# Patient Record
Sex: Female | Born: 1955 | Race: White | Hispanic: No | Marital: Single | Smoking: Never smoker
Health system: Southern US, Community
[De-identification: ages and names within clinical notes are randomized; demographics above are authoritative.]

## PROBLEM LIST (undated history)

## (undated) DIAGNOSIS — E559 Vitamin D deficiency, unspecified: Secondary | ICD-10-CM

## (undated) DIAGNOSIS — Z973 Presence of spectacles and contact lenses: Secondary | ICD-10-CM

## (undated) DIAGNOSIS — E209 Hypoparathyroidism, unspecified: Secondary | ICD-10-CM

## (undated) DIAGNOSIS — I499 Cardiac arrhythmia, unspecified: Secondary | ICD-10-CM

## (undated) DIAGNOSIS — E039 Hypothyroidism, unspecified: Secondary | ICD-10-CM

## (undated) DIAGNOSIS — E669 Obesity, unspecified: Secondary | ICD-10-CM

## (undated) DIAGNOSIS — M25569 Pain in unspecified knee: Secondary | ICD-10-CM

## (undated) DIAGNOSIS — K08109 Complete loss of teeth, unspecified cause, unspecified class: Secondary | ICD-10-CM

## (undated) DIAGNOSIS — N189 Chronic kidney disease, unspecified: Secondary | ICD-10-CM

## (undated) DIAGNOSIS — M7989 Other specified soft tissue disorders: Secondary | ICD-10-CM

## (undated) DIAGNOSIS — E785 Hyperlipidemia, unspecified: Secondary | ICD-10-CM

## (undated) DIAGNOSIS — K000 Anodontia: Secondary | ICD-10-CM

## (undated) DIAGNOSIS — N649 Disorder of breast, unspecified: Secondary | ICD-10-CM

## (undated) DIAGNOSIS — Z789 Other specified health status: Secondary | ICD-10-CM

## (undated) DIAGNOSIS — F79 Unspecified intellectual disabilities: Secondary | ICD-10-CM

## (undated) HISTORY — DX: Hypoparathyroidism, unspecified: E20.9

## (undated) HISTORY — DX: Disorder of breast, unspecified: N64.9

## (undated) HISTORY — DX: Obesity, unspecified: E66.9

## (undated) HISTORY — DX: Pain in unspecified knee: M25.569

## (undated) HISTORY — DX: Other specified soft tissue disorders: M79.89

## (undated) HISTORY — PX: BREAST SURGERY: SHX581

## (undated) HISTORY — DX: Hyperlipidemia, unspecified: E78.5

## (undated) HISTORY — DX: Chronic kidney disease, unspecified: N18.9

## (undated) HISTORY — PX: MULTIPLE TOOTH EXTRACTIONS: SHX2053

## (undated) HISTORY — DX: Cardiac arrhythmia, unspecified: I49.9

## (undated) HISTORY — DX: Vitamin D deficiency, unspecified: E55.9

## (undated) HISTORY — DX: Hypothyroidism, unspecified: E03.9

## (undated) HISTORY — PX: TONSILLECTOMY: SUR1361

---

## 2011-09-25 ENCOUNTER — Other Ambulatory Visit (HOSPITAL_COMMUNITY): Payer: Self-pay | Admitting: Family Medicine

## 2011-09-25 DIAGNOSIS — Z1231 Encounter for screening mammogram for malignant neoplasm of breast: Secondary | ICD-10-CM

## 2011-10-17 ENCOUNTER — Ambulatory Visit (HOSPITAL_COMMUNITY): Payer: Self-pay

## 2011-11-01 ENCOUNTER — Ambulatory Visit (HOSPITAL_COMMUNITY)
Admission: RE | Admit: 2011-11-01 | Discharge: 2011-11-01 | Disposition: A | Discharge status: Home / Self Care | Payer: Medicaid Other | Source: Ambulatory Visit | Attending: Family Medicine | Admitting: Family Medicine

## 2011-11-01 DIAGNOSIS — Z1231 Encounter for screening mammogram for malignant neoplasm of breast: Secondary | ICD-10-CM | POA: Insufficient documentation

## 2011-11-03 ENCOUNTER — Other Ambulatory Visit: Payer: Self-pay | Admitting: Family Medicine

## 2011-11-03 DIAGNOSIS — R928 Other abnormal and inconclusive findings on diagnostic imaging of breast: Secondary | ICD-10-CM

## 2011-11-10 ENCOUNTER — Other Ambulatory Visit: Payer: Self-pay | Admitting: Family Medicine

## 2011-11-10 ENCOUNTER — Ambulatory Visit
Admission: RE | Admit: 2011-11-10 | Discharge: 2011-11-10 | Disposition: A | Discharge status: Home / Self Care | Payer: Medicaid Other | Source: Ambulatory Visit | Attending: Family Medicine | Admitting: Family Medicine

## 2011-11-10 DIAGNOSIS — R928 Other abnormal and inconclusive findings on diagnostic imaging of breast: Secondary | ICD-10-CM

## 2011-11-16 ENCOUNTER — Ambulatory Visit
Admission: RE | Admit: 2011-11-16 | Discharge: 2011-11-16 | Disposition: A | Discharge status: Home / Self Care | Payer: Medicaid Other | Source: Ambulatory Visit | Attending: Family Medicine | Admitting: Family Medicine

## 2011-11-16 ENCOUNTER — Other Ambulatory Visit: Payer: Self-pay | Admitting: Family Medicine

## 2011-11-16 DIAGNOSIS — R928 Other abnormal and inconclusive findings on diagnostic imaging of breast: Secondary | ICD-10-CM

## 2011-11-17 ENCOUNTER — Inpatient Hospital Stay: Admission: RE | Admit: 2011-11-17 | Payer: Medicaid Other | Source: Ambulatory Visit

## 2011-12-01 ENCOUNTER — Encounter (INDEPENDENT_AMBULATORY_CARE_PROVIDER_SITE_OTHER): Payer: Self-pay | Admitting: Surgery

## 2011-12-01 ENCOUNTER — Ambulatory Visit (INDEPENDENT_AMBULATORY_CARE_PROVIDER_SITE_OTHER): Payer: Medicaid Other | Admitting: Surgery

## 2011-12-01 ENCOUNTER — Other Ambulatory Visit (INDEPENDENT_AMBULATORY_CARE_PROVIDER_SITE_OTHER): Payer: Self-pay | Admitting: Surgery

## 2011-12-01 VITALS — BP 142/88 | HR 80 | Temp 97.8°F | Resp 18 | Ht 62.0 in | Wt 172.4 lb

## 2011-12-01 DIAGNOSIS — L905 Scar conditions and fibrosis of skin: Secondary | ICD-10-CM

## 2011-12-01 DIAGNOSIS — N6489 Other specified disorders of breast: Secondary | ICD-10-CM

## 2011-12-01 NOTE — Patient Instructions (Signed)
Lumpectomy, Breast Conserving Surgery A lumpectomy is breast surgery that removes only part of the breast. Another name used may be partial mastectomy. The amount removed varies. Make sure you understand how much of your breast will be removed. Reasons for a lumpectomy:  Any solid breast mass.   Grouped significant nodularity that may be confused with a solitary breast mass.  Lumpectomy is the most common form of breast cancer surgery today. The surgeon removes the portion of your breast which contains the tumor (cancer). This is the lump. Some normal tissue around the lump is also removed to be sure that all the tumor has been removed.  If cancer cells are found in the margins where the breast tissue was removed, your surgeon will do more surgery to remove the remaining cancer tissue. This is called re-excision surgery. Radiation and/or chemotherapy treatments are often given following a lumpectomy to kill any cancer cells that could possibly remain.  REASONS YOU MAY NOT BE ABLE TO HAVE BREAST CONSERVING SURGERY:  The tumor is located in more than one place.   Your breast is small and the tumor is large so the breast would be disfigured.   The entire tumor removal is not successful with a lumpectomy.   You cannot commit to a full course of chemotherapy, radiation therapy or are pregnant and cannot have radiation.   You have previously had radiation to the breast to treat cancer.  HOW A LUMPECTOMY IS PERFORMED If overnight nursing is not required following a biopsy, a lumpectomy can be performed as a same-day surgery. This can be done in a hospital, clinic, or surgical center. The anesthesia used will depend on your surgeon. They will discuss this with you. A general anesthetic keeps you sleeping through the procedure. LET YOUR CAREGIVERS KNOW ABOUT THE FOLLOWING:  Allergies   Medications taken including herbs, eye drops, over the counter medications, and creams.   Use of steroids (by  mouth or creams)   Previous problems with anesthetics or Novocaine.   Possibility of pregnancy, if this applies   History of blood clots (thrombophlebitis)   History of bleeding or blood problems.   Previous surgery   Other health problems  BEFORE THE PROCEDURE You should be present one hour prior to your procedure unless directed otherwise.  AFTER THE PROCEDURE  After surgery, you will be taken to the recovery area where a nurse will watch and check your progress. Once you're awake, stable, and taking fluids well, barring other problems you will be allowed to go home.   Ice packs applied to your operative site may help with discomfort and keep the swelling down.   A small rubber drain may be placed in the breast for a couple of days to prevent a hematoma from developing in the breast.   A pressure dressing may be applied for 24 to 48 hours to prevent bleeding.   Keep the wound dry.   You may resume a normal diet and activities as directed. Avoid strenuous activities affecting the arm on the side of the biopsy site such as tennis, swimming, heavy lifting (more than 10 pounds) or pulling.   Bruising in the breast is normal following this procedure.   Wearing a bra - even to bed - may be more comfortable and also help keep the dressing on.   Change dressings as directed.   Only take over-the-counter or prescription medicines for pain, discomfort, or fever as directed by your caregiver.  Call for your results as   instructed by your surgeon. Remember it is your responsibility to get the results of your lumpectomy if your surgeon asked you to follow-up. Do not assume everything is fine if you have not heard from your caregiver. SEEK MEDICAL CARE IF:   There is increased bleeding (more than a small spot) from the wound.   You notice redness, swelling, or increasing pain in the wound.   Pus is coming from wound.   An unexplained oral temperature above 102 F (38.9 C) develops.     You notice a foul smell coming from the wound or dressing.  SEEK IMMEDIATE MEDICAL CARE IF:   You develop a rash.   You have difficulty breathing.   You have any allergic problems.  Document Released: 04/17/2006 Document Revised: 02/23/2011 Document Reviewed: 07/19/2006 ExitCare Patient Information 2012 [employer]. Lumpectomy, Breast Conserving Surgery Care After Please read the instructions outlined below and refer to this sheet in the next few weeks. These discharge instructions provide you with general information on caring for yourself after you leave the hospital. Your surgeon may also give you specific instructions. While your treatment has been planned according to the most current medical practices available, unavoidable complications occasionally occur. If you have any problems or questions after discharge, please call your surgeon. Reasons for a lumpectomy:  Any solid breast mass.   Grouped significant nodularity that may be confused with a solitary breast mass.  AFTER THE PROCEDURE  After surgery, you will be taken to the recovery area where a nurse will watch and check your progress. Once you're awake, stable, and taking fluids well, barring other problems you will be allowed to go home.   Ice packs applied to your operative site may help with discomfort and keep the swelling down.   A small rubber drain may be placed in the incision for a couple of days to prevent a hematoma in the breast.   A pressure dressing may be applied for 24 to 48 hours to prevent bleeding.   Keep the wound dry.   You may resume a normal diet and activities as directed. Avoid strenuous activities affecting the arm on the side of the biopsy site such as tennis, swimming, heavy lifting (more than 10 pounds) or pulling.   Bruising in the breast is normal following this procedure.   Wearing a bra - even to bed - may be more comfortable and also help keep the dressing on.   Change  dressings as directed.   Only take over-the-counter or prescription medicines for pain, discomfort, or fever as directed by your caregiver.  Call for your results as instructed by your surgeon. Remember it isyour responsibility to get the results of your lumpectomy if your surgeon asked you to follow-up. Do not assume everything is fine if you have not heard from your caregiver. SEEK MEDICAL CARE IF:   There is increased bleeding (more than a small spot) from the wound.   You notice redness, swelling, or increasing pain in the wound.   Pus is coming from wound.   An unexplained oral temperature above 102 F (38.9 C) develops.   You notice a foul smell coming from the wound or dressing.  SEEK IMMEDIATE MEDICAL CARE IF:   You develop a rash.   You have difficulty breathing.   You have any allergic problems.  Document Released: 03/22/2006 Document Revised: 02/23/2011 Document Reviewed: 02/22/2007 ExitCare Patient Information 2012 [employer]. 

## 2011-12-01 NOTE — Progress Notes (Signed)
Patient ID: Martha Bowers, female   DOB: Jun 20, 1955, 56 y.o.   MRN: [id number]  Chief Complaint  Patient presents with  . Breast Problem    new pt- eval breast lesion    HPI Martha Bowers is a 56 y.o. female.  Patient sent at the request of Dr. Nedra Hai for abnormal mammogram. Patient had abnormal cluster of calcifications in the right breast. Core biopsy was done which showed complex sclerosing lesion worrisome for radial scar. Patient denies breast mass, breast pain or nipple discharge bilaterally. HPI  Past Medical History  Diagnosis Date  . Breast lesion     right breast    History reviewed. No pertinent past surgical history.  Family History  Problem Relation Age of Onset  . Heart disease Mother   . Cancer Father     lung    Social History History  Substance Use Topics  . Smoking status: Never Smoker   . Smokeless tobacco: Not on file  . Alcohol Use: No    No Known Allergies  Current Outpatient Prescriptions  Medication Sig Dispense Refill  . LEVOTHYROXINE SODIUM PO Take by mouth daily.        Review of Systems Review of Systems  Constitutional: Negative for fever, chills and unexpected weight change.  HENT: Negative for hearing loss, congestion, sore throat, trouble swallowing and voice change.   Eyes: Negative for visual disturbance.  Respiratory: Negative for cough and wheezing.   Cardiovascular: Negative for chest pain, palpitations and leg swelling.  Gastrointestinal: Negative for nausea, vomiting, abdominal pain, diarrhea, constipation, blood in stool, abdominal distention and anal bleeding.  Genitourinary: Negative for hematuria, vaginal bleeding and difficulty urinating.  Musculoskeletal: Negative for arthralgias.  Skin: Negative for rash and wound.  Neurological: Negative for seizures, syncope and headaches.  Hematological: Negative for adenopathy. Does not bruise/bleed easily.  Psychiatric/Behavioral: Negative for confusion.    Blood pressure  142/88, pulse 80, temperature 97.8 F (36.6 C), temperature source Temporal, resp. rate 18, height 5\' 2"  (1.575 m), weight 172 lb 6.4 oz (78.2 kg).  Physical Exam Physical Exam  Constitutional: She appears well-developed and well-nourished.  HENT:  Head: Normocephalic and atraumatic.  Eyes: EOM are normal. Pupils are equal, round, and reactive to light.  Neck: Normal range of motion. Neck supple.  Cardiovascular: Normal rate and regular rhythm.   Pulmonary/Chest: Right breast exhibits no inverted nipple, no mass, no nipple discharge, no skin change and no tenderness. Left breast exhibits no inverted nipple, no mass, no nipple discharge, no skin change and no tenderness. Breasts are symmetrical.      Data Reviewed RADIOLOGY REPORT*  Clinical Data: The patient returns after baseline screening exam  for evaluation of calcifications in the right breast.  DIGITAL DIAGNOSTIC RIGHT MAMMOGRAM  Comparison: 11/01/2011  Findings: Magnified views are performed of calcifications in the  lower central portion of the right breast. On magnified views,  calcifications vary in size, shape, and density.  IMPRESSION:  Indeterminate calcifications in the right breast. I discussed the  findings with the patient and her sister who acts as Theatre stage manager. By our discussion, it appears that the patient would be  able to undergo stereotactic biopsy, possibly with some anxiolytic  prior to procedure.  RECOMMENDATION:  Surgical guided core biopsy, scheduled for 11/16/2011 3 o'clock  p.m.  BI-RADS CATEGORY 4: Suspicious abnormality - biopsy should be  considered.  Path:  Sclerosing  Complex lesion right breast  Radial scar Assessment    Right breast radial scar.  Plan    Discussed diagnosis with the patient and her sister.  Options include observation vs  Excision. Risk of malignancy with radial scar is less than 10% but present.    discussed all options for surgical and nonsurgical treatment.  They would like to proceed with right breast partial mastectomy with needle localization.The procedure has been discussed with the patient. Alternatives to surgery have been discussed with the patient.  Risks of surgery include bleeding,  Infection,  Seroma formation, death,  and the need for further surgery.   The patient understands and wishes to proceed.       Martha Bowers A. 12/01/2011, 10:16 AM

## 2012-01-05 ENCOUNTER — Encounter (HOSPITAL_BASED_OUTPATIENT_CLINIC_OR_DEPARTMENT_OTHER): Payer: Self-pay | Admitting: *Deleted

## 2012-01-05 NOTE — Progress Notes (Signed)
Pt is mentally handicapped. She is able to bath and care for herself, can cook and clean-sister is her legal guardian since their mother passed away.pt very healthy-

## 2012-01-10 ENCOUNTER — Encounter (HOSPITAL_BASED_OUTPATIENT_CLINIC_OR_DEPARTMENT_OTHER)
Admission: RE | Admit: 2012-01-10 | Discharge: 2012-01-10 | Disposition: A | Discharge status: Home / Self Care | Payer: Medicaid Other | Source: Ambulatory Visit | Attending: Surgery | Admitting: Surgery

## 2012-01-10 ENCOUNTER — Ambulatory Visit
Admission: RE | Admit: 2012-01-10 | Discharge: 2012-01-10 | Disposition: A | Discharge status: Home / Self Care | Payer: Medicaid Other | Source: Ambulatory Visit | Attending: Surgery | Admitting: Surgery

## 2012-01-10 LAB — COMPREHENSIVE METABOLIC PANEL
AST: 15 U/L (ref 0–37)
Albumin: 3.8 g/dL (ref 3.5–5.2)
Alkaline Phosphatase: 64 U/L (ref 39–117)
CO2: 28 mEq/L (ref 19–32)
Chloride: 98 mEq/L (ref 96–112)
Creatinine, Ser: 0.98 mg/dL (ref 0.50–1.10)
GFR calc non Af Amer: 62 mL/min — ABNORMAL LOW (ref >90)
Potassium: 4 mEq/L (ref 3.5–5.1)
Total Bilirubin: 0.5 mg/dL (ref 0.3–1.2)

## 2012-01-12 ENCOUNTER — Ambulatory Visit (HOSPITAL_BASED_OUTPATIENT_CLINIC_OR_DEPARTMENT_OTHER): Payer: PRIVATE HEALTH INSURANCE | Admitting: *Deleted

## 2012-01-12 ENCOUNTER — Encounter (HOSPITAL_BASED_OUTPATIENT_CLINIC_OR_DEPARTMENT_OTHER): Payer: Self-pay | Admitting: *Deleted

## 2012-01-12 ENCOUNTER — Ambulatory Visit
Admission: RE | Admit: 2012-01-12 | Discharge: 2012-01-12 | Disposition: A | Discharge status: Home / Self Care | Payer: Medicaid Other | Source: Ambulatory Visit | Attending: Surgery | Admitting: Surgery

## 2012-01-12 ENCOUNTER — Encounter (HOSPITAL_BASED_OUTPATIENT_CLINIC_OR_DEPARTMENT_OTHER): Payer: Self-pay | Admitting: Anesthesiology

## 2012-01-12 ENCOUNTER — Ambulatory Visit (HOSPITAL_BASED_OUTPATIENT_CLINIC_OR_DEPARTMENT_OTHER)
Admission: RE | Admit: 2012-01-12 | Discharge: 2012-01-12 | Disposition: A | Discharge status: Home / Self Care | Payer: PRIVATE HEALTH INSURANCE | Source: Ambulatory Visit | Attending: Surgery | Admitting: Surgery

## 2012-01-12 ENCOUNTER — Encounter (HOSPITAL_BASED_OUTPATIENT_CLINIC_OR_DEPARTMENT_OTHER)
Admission: RE | Disposition: A | Discharge status: Home / Self Care | Payer: Self-pay | Source: Ambulatory Visit | Attending: Surgery

## 2012-01-12 DIAGNOSIS — N6489 Other specified disorders of breast: Secondary | ICD-10-CM

## 2012-01-12 DIAGNOSIS — N6089 Other benign mammary dysplasias of unspecified breast: Secondary | ICD-10-CM | POA: Insufficient documentation

## 2012-01-12 DIAGNOSIS — R920 Mammographic microcalcification found on diagnostic imaging of breast: Secondary | ICD-10-CM | POA: Insufficient documentation

## 2012-01-12 DIAGNOSIS — N6019 Diffuse cystic mastopathy of unspecified breast: Secondary | ICD-10-CM

## 2012-01-12 HISTORY — DX: Unspecified intellectual disabilities: F79

## 2012-01-12 HISTORY — DX: Complete loss of teeth, unspecified cause, unspecified class: K08.109

## 2012-01-12 HISTORY — DX: Anodontia: K00.0

## 2012-01-12 HISTORY — DX: Presence of spectacles and contact lenses: Z97.3

## 2012-01-12 HISTORY — DX: Other specified health status: Z78.9

## 2012-01-12 LAB — CBC WITH DIFFERENTIAL/PLATELET
Basophils Relative: 0 % (ref 0–1)
Eosinophils Absolute: 0.1 10*3/uL (ref 0.0–0.7)
HCT: 35.2 % — ABNORMAL LOW (ref 36.0–46.0)
Hemoglobin: 11.8 g/dL — ABNORMAL LOW (ref 12.0–15.0)
Lymphs Abs: 2.1 10*3/uL (ref 0.7–4.0)
MCH: 30.3 pg (ref 26.0–34.0)
MCHC: 33.5 g/dL (ref 30.0–36.0)
MCV: 90.5 fL (ref 78.0–100.0)
Monocytes Absolute: 0.6 10*3/uL (ref 0.1–1.0)
Monocytes Relative: 6 % (ref 3–12)
RBC: 3.89 MIL/uL (ref 3.87–5.11)

## 2012-01-12 SURGERY — PARTIAL MASTECTOMY WITH NEEDLE LOCALIZATION
Anesthesia: General | Site: Breast | Laterality: Right | Wound class: Clean

## 2012-01-12 MED ORDER — CHLORHEXIDINE GLUCONATE 4 % EX LIQD: 1.0000 "application " | Freq: Once | CUTANEOUS | Status: DC

## 2012-01-12 MED ORDER — BUPIVACAINE-EPINEPHRINE 0.25% -1:200000 IJ SOLN
INTRAMUSCULAR | Status: DC | PRN
  Administered 2012-01-12: 20 mL

## 2012-01-12 MED ORDER — HYDROCODONE-ACETAMINOPHEN 5-325 MG PO TABS: 1.0000 | ORAL_TABLET | Freq: Four times a day (QID) | ORAL | Status: DC | PRN

## 2012-01-12 MED ORDER — ONDANSETRON HCL 4 MG/2ML IJ SOLN: 4.0000 mg | Freq: Once | INTRAMUSCULAR | Status: DC | PRN

## 2012-01-12 MED ORDER — FENTANYL CITRATE 0.05 MG/ML IJ SOLN
INTRAMUSCULAR | Status: DC | PRN
  Administered 2012-01-12: 50 ug via INTRAVENOUS
  Administered 2012-01-12: 25 ug via INTRAVENOUS

## 2012-01-12 MED ORDER — DEXTROSE 5 % IV SOLN: 3.0000 g | INTRAVENOUS | Status: DC

## 2012-01-12 MED ORDER — CEPHALEXIN 250 MG PO CAPS: 500.0000 mg | ORAL_CAPSULE | Freq: Three times a day (TID) | ORAL | Status: AC

## 2012-01-12 MED ORDER — DEXAMETHASONE SODIUM PHOSPHATE 4 MG/ML IJ SOLN
INTRAMUSCULAR | Status: DC | PRN
  Administered 2012-01-12: 10 mg via INTRAVENOUS

## 2012-01-12 MED ORDER — OXYCODONE HCL 5 MG/5ML PO SOLN: 5.0000 mg | Freq: Once | ORAL | Status: DC | PRN

## 2012-01-12 MED ORDER — LACTATED RINGERS IV SOLN
INTRAVENOUS | Status: DC
  Administered 2012-01-12: 14:00:00 via INTRAVENOUS

## 2012-01-12 MED ORDER — LIDOCAINE HCL (CARDIAC) 20 MG/ML IV SOLN
INTRAVENOUS | Status: DC | PRN
  Administered 2012-01-12: 60 mg via INTRAVENOUS

## 2012-01-12 MED ORDER — PROPOFOL 10 MG/ML IV BOLUS
INTRAVENOUS | Status: DC | PRN
  Administered 2012-01-12: 140 mg via INTRAVENOUS

## 2012-01-12 MED ORDER — OXYCODONE HCL 5 MG PO TABS: 5.0000 mg | ORAL_TABLET | Freq: Once | ORAL | Status: DC | PRN

## 2012-01-12 MED ORDER — FLUCONAZOLE 100 MG PO TABS: 200.0000 mg | ORAL_TABLET | Freq: Every day | ORAL | Status: DC

## 2012-01-12 MED ORDER — HYDROMORPHONE HCL PF 1 MG/ML IJ SOLN: 0.2500 mg | INTRAMUSCULAR | Status: DC | PRN

## 2012-01-12 MED ORDER — ONDANSETRON HCL 4 MG/2ML IJ SOLN
INTRAMUSCULAR | Status: DC | PRN
  Administered 2012-01-12: 12 mg via INTRAVENOUS

## 2012-01-12 SURGICAL SUPPLY — 42 items
BLADE SURG 15 STRL LF DISP TIS (BLADE) ×1 IMPLANT
BLADE SURG 15 STRL SS (BLADE) ×1
CANISTER SUCTION 1200CC (MISCELLANEOUS) ×2 IMPLANT
CHLORAPREP W/TINT 26ML (MISCELLANEOUS) ×2 IMPLANT
CLIP TI WIDE RED SMALL 6 (CLIP) IMPLANT
CLOTH BEACON ORANGE TIMEOUT ST (SAFETY) ×2 IMPLANT
COVER MAYO STAND STRL (DRAPES) ×2 IMPLANT
COVER TABLE BACK 60X90 (DRAPES) ×2 IMPLANT
DECANTER SPIKE VIAL GLASS SM (MISCELLANEOUS) ×2 IMPLANT
DERMABOND ADVANCED (GAUZE/BANDAGES/DRESSINGS) ×1
DERMABOND ADVANCED .7 DNX12 (GAUZE/BANDAGES/DRESSINGS) ×1 IMPLANT
DEVICE DUBIN W/COMP PLATE 8390 (MISCELLANEOUS) ×2 IMPLANT
DRAPE LAPAROSCOPIC ABDOMINAL (DRAPES) IMPLANT
DRAPE PED LAPAROTOMY (DRAPES) ×2 IMPLANT
DRAPE UTILITY XL STRL (DRAPES) ×2 IMPLANT
ELECT COATED BLADE 2.86 ST (ELECTRODE) ×2 IMPLANT
ELECT REM PT RETURN 9FT ADLT (ELECTROSURGICAL) ×2
ELECTRODE REM PT RTRN 9FT ADLT (ELECTROSURGICAL) ×1 IMPLANT
GLOVE BIOGEL PI IND STRL 8 (GLOVE) ×1 IMPLANT
GLOVE BIOGEL PI INDICATOR 8 (GLOVE) ×1
GLOVE ECLIPSE 8.0 STRL XLNG CF (GLOVE) ×2 IMPLANT
GLOVE SKINSENSE NS SZ7.0 (GLOVE) ×1
GLOVE SKINSENSE STRL SZ7.0 (GLOVE) ×1 IMPLANT
GOWN PREVENTION PLUS XLARGE (GOWN DISPOSABLE) ×4 IMPLANT
KIT MARKER MARGIN INK (KITS) IMPLANT
NEEDLE HYPO 25X1 1.5 SAFETY (NEEDLE) ×2 IMPLANT
NS IRRIG 1000ML POUR BTL (IV SOLUTION) ×2 IMPLANT
PACK BASIN DAY SURGERY FS (CUSTOM PROCEDURE TRAY) ×2 IMPLANT
PENCIL BUTTON HOLSTER BLD 10FT (ELECTRODE) ×2 IMPLANT
SLEEVE SCD COMPRESS KNEE MED (MISCELLANEOUS) ×2 IMPLANT
SPONGE LAP 4X18 X RAY DECT (DISPOSABLE) ×2 IMPLANT
STAPLER VISISTAT 35W (STAPLE) IMPLANT
SUT MON AB 4-0 PC3 18 (SUTURE) ×2 IMPLANT
SUT SILK 2 0 SH (SUTURE) IMPLANT
SUT VIC AB 3-0 SH 27 (SUTURE) ×1
SUT VIC AB 3-0 SH 27X BRD (SUTURE) ×1 IMPLANT
SYR CONTROL 10ML LL (SYRINGE) ×2 IMPLANT
TOWEL OR 17X24 6PK STRL BLUE (TOWEL DISPOSABLE) ×2 IMPLANT
TOWEL OR NON WOVEN STRL DISP B (DISPOSABLE) ×2 IMPLANT
TUBE CONNECTING 20X1/4 (TUBING) ×2 IMPLANT
WATER STERILE IRR 1000ML POUR (IV SOLUTION) IMPLANT
YANKAUER SUCT BULB TIP NO VENT (SUCTIONS) ×2 IMPLANT

## 2012-01-12 NOTE — Progress Notes (Signed)
Pt. Family at side, d/c instructions reviewed along with extra Rx for Diflucan called in via MD to [facility]. Karin Golden RN verified correct order and dosage with Pharmacy.  Pt family verbalized understanding.

## 2012-01-12 NOTE — Anesthesia Preprocedure Evaluation (Addendum)
Anesthesia Evaluation  Patient identified by MRN, date of birth, ID band Patient awake    Reviewed: Allergy & Precautions, H&P , NPO status , Patient's Chart, lab work & pertinent test results  Airway Mallampati: I TM Distance: >3 FB Neck ROM: Full    Dental  (+) Edentulous Upper and Edentulous Lower   Pulmonary  breath sounds clear to auscultation        Cardiovascular Rhythm:Regular Rate:Normal     Neuro/Psych    GI/Hepatic   Endo/Other    Renal/GU      Musculoskeletal   Abdominal   Peds  Hematology   Anesthesia Other Findings   Reproductive/Obstetrics                           Anesthesia Physical Anesthesia Plan  ASA: II  Anesthesia Plan: General   Post-op Pain Management:    Induction: Intravenous  Airway Management Planned: LMA  Additional Equipment:   Intra-op Plan:   Post-operative Plan: Extubation in OR  Informed Consent: I have reviewed the patients History and Physical, chart, labs and discussed the procedure including the risks, benefits and alternatives for the proposed anesthesia with the patient or authorized representative who has indicated his/her understanding and acceptance.   Dental advisory given  Plan Discussed with: CRNA, Anesthesiologist and Surgeon  Anesthesia Plan Comments:         Anesthesia Quick Evaluation

## 2012-01-12 NOTE — Interval H&P Note (Signed)
History and Physical Interval Note:  01/12/2012 2:17 PM  Martha Bowers  has presented today for surgery, with the diagnosis of Right breast microcalcifications  The various methods of treatment have been discussed with the patient and family. After consideration of risks, benefits and other options for treatment, the patient has consented to  Procedure(s) (LRB) with comments: PARTIAL MASTECTOMY WITH NEEDLE LOCALIZATION (Right) - Right breast Needle localization partial mastectomy as a surgical intervention .  The patient's history has been reviewed, patient examined, no change in status, stable for surgery.  I have reviewed the patient's chart and labs.  Questions were answered to the patient's satisfaction.     Deaunna Olarte A.

## 2012-01-12 NOTE — Transfer of Care (Signed)
Immediate Anesthesia Transfer of Care Note  Patient: Martha Bowers  Procedure(s) Performed: Procedure(s) (LRB) with comments: PARTIAL MASTECTOMY WITH NEEDLE LOCALIZATION (Right) - Right breast Needle localization partial mastectomy  Patient Location: PACU  Anesthesia Type: General  Level of Consciousness: awake and alert   Airway & Oxygen Therapy: Patient Spontanous Breathing and Patient connected to face mask oxygen  Post-op Assessment: Report given to PACU RN and Post -op Vital signs reviewed and stable  Post vital signs: Reviewed and stable  Complications: No apparent anesthesia complications

## 2012-01-12 NOTE — Anesthesia Procedure Notes (Signed)
Procedure Name: LMA Insertion Date/Time: 01/12/2012 2:40 PM Performed by: Meyer Russel Pre-anesthesia Checklist: Patient identified, Emergency Drugs available, Suction available and Patient being monitored Patient Re-evaluated:Patient Re-evaluated prior to inductionOxygen Delivery Method: Circle System Utilized Preoxygenation: Pre-oxygenation with 100% oxygen Intubation Type: IV induction Ventilation: Mask ventilation without difficulty LMA: LMA inserted LMA Size: 4.0 Number of attempts: 1 Airway Equipment and Method: bite block Placement Confirmation: positive ETCO2 and breath sounds checked- equal and bilateral Tube secured with: Tape Dental Injury: Teeth and Oropharynx as per pre-operative assessment

## 2012-01-12 NOTE — H&P (Signed)
Demographics Martha Bowers 56 year old female  Comm Pref: None [address]  [place] [address] [phone] (H) [phone] (M)    Problem ListNone  Significant History/Details  Smoking: Never Smoker   Smokeless Tobacco: Unknown  Alcohol: No  1 open order  Language: English   Specialty CommentsEditShow AllReport8/30/13-NPI#[id number] Auth 6 visits (+sx)/[place] [facility] [phone]  DOS: 01/12/12-TC- [facility]- OP/ Rt Br NL part masty/gen-pat e 12/01/11 12/01/2011 patient scheduled for op surgery 01/12/2012 @ [facility]. (pae,chm)    MedicationsLong-Term LEVOTHYROXINE SODIUM PO     Relevant Labs (3 years)  Na K Cl C02 WBC Hgb Hct Plts  01/10/12 0900 140 4.0 98 -- -- -- -- --                  Relevant Encounters (Maximum of 10 visits)Date Type Department Provider Description  01/12/2012 Surgery  [facility] Jazzlyn Huizenga A., MD   12/01/2011 Office Visit [facility] Harriette Bouillon A., MD Radial Scar of Breast (Primary Dx)          My Last Outpatient Progress NoteStatus Last Edited Encounter Date  Patient ID: Martha Bowers, female   DOB: 01-03-1956, 56 y.o.   MRN: [id number]    Chief Complaint   Patient presents with   .  Breast Problem       new pt- eval breast lesion      HPI Martha Bowers is a 56 y.o. female.  Patient sent at the request of Dr. Nedra Hai for abnormal mammogram. Patient had abnormal cluster of calcifications in the right breast. Core biopsy was done which showed complex sclerosing lesion worrisome for radial scar. Patient denies breast mass, breast pain or nipple discharge bilaterally. HPI    Past Medical History   Diagnosis  Date   .  Breast lesion         right breast      History reviewed. No pertinent past surgical history.    Family History   Problem  Relation  Age of Onset   .  Heart disease  Mother     .  Cancer  Father         lung      Social History History     Substance Use Topics   .  Smoking status:  Never Smoker    .  Smokeless tobacco:  Not on file   .  Alcohol Use:  No      No Known Allergies    Current Outpatient Prescriptions   Medication  Sig  Dispense  Refill   .  LEVOTHYROXINE SODIUM PO  Take by mouth daily.            Review of Systems Review of Systems  Constitutional: Negative for fever, chills and unexpected weight change.  HENT: Negative for hearing loss, congestion, sore throat, trouble swallowing and voice change.   Eyes: Negative for visual disturbance.  Respiratory: Negative for cough and wheezing.   Cardiovascular: Negative for chest pain, palpitations and leg swelling.  Gastrointestinal: Negative for nausea, vomiting, abdominal pain, diarrhea, constipation, blood in stool, abdominal distention and anal bleeding.  Genitourinary: Negative for hematuria, vaginal bleeding and difficulty urinating.  Musculoskeletal: Negative for arthralgias.  Skin: Negative for rash and wound.  Neurological: Negative for seizures, syncope and headaches.  Hematological: Negative for adenopathy. Does not bruise/bleed easily.  Psychiatric/Behavioral: Negative for confusion.    Blood pressure 142/88, pulse 80, temperature 97.8 F (36.6 C), temperature  source Temporal, resp. rate 18, height 5\' 2"  (1.575 m), weight 172 lb 6.4 oz (78.2 kg).   Physical Exam Physical Exam  Constitutional: She appears well-developed and well-nourished.  HENT:   Head: Normocephalic and atraumatic.  Eyes: EOM are normal. Pupils are equal, round, and reactive to light.  Neck: Normal range of motion. Neck supple.  Cardiovascular: Normal rate and regular rhythm.   Pulmonary/Chest: Right breast exhibits no inverted nipple, no mass, no nipple discharge, no skin change and no tenderness. Left breast exhibits no inverted nipple, no mass, no nipple discharge, no skin change and no tenderness. Breasts are symmetrical.      Data Reviewed RADIOLOGY REPORT*    Clinical Data: The patient returns after baseline screening exam   for evaluation of calcifications in the right breast.   DIGITAL DIAGNOSTIC RIGHT MAMMOGRAM   Comparison: 11/01/2011   Findings: Magnified views are performed of calcifications in the   lower central portion of the right breast. On magnified views,   calcifications vary in size, shape, and density.   IMPRESSION:   Indeterminate calcifications in the right breast. I discussed the   findings with the patient and her sister who acts as Musician. By our discussion, it appears that the patient would be   able to undergo stereotactic biopsy, possibly with some anxiolytic   prior to procedure.   RECOMMENDATION:   Surgical guided core biopsy, scheduled for 11/16/2011 3 o'clock   p.m.   BI-RADS CATEGORY 4: Suspicious abnormality - biopsy should be   considered.   Path:  Sclerosing  Complex lesion right breast  Radial scar Assessment Right breast radial scar.   Plan Discussed diagnosis with the patient and her sister.  Options include observation vs  Excision. Risk of malignancy with radial scar is less than 10% but present.    discussed all options for surgical and nonsurgical treatment. They would like to proceed with right breast partial mastectomy with needle localization.The procedure has been discussed with the patient. Alternatives to surgery have been discussed with the patient.  Risks of surgery include bleeding,  Infection,  Seroma formation, death,  and the need for further surgery.   The patient understands and wishes to proceed.       Kijana Cromie A. 01/12/2012

## 2012-01-12 NOTE — Op Note (Signed)
Martha Bowers  1956/01/22  [id number]  01/12/2012   Preoperative diagnosis: Right breast microcalcifications  Postoperative diagnosis: Same  Procedure: Right breast needle localized partial mastectomy  Surgeon: Dortha Schwalbe, MD, FACS  Anesthesia: General and 0.25 % sensoricane with epinephrine  Clinical History and Indications: this patient presents for a guidewire localized partial mastectomy of a right breast area of calcifications.The procedure has been discussed with the patient. Alternatives to surgery have been discussed with the patient.  Risks of surgery include bleeding,  Infection,  Seroma formation, death,  and the need for further surgery.   The patient understands and wishes to proceed.  Description of procedure: The patient was seen in the holding area and the plans for the procedure reviewed. The right  breast was marked as the operative side. The wire localizing films were reviewed.  The patient was taken to the operating room and after satisfactory general anesthesia had been obtained the right breast was prepped and draped and the timeout was performed.  The incision was made over the presumed area of the mass. Skin flaps were raised and using cautery the area was completely excised. Bleeders were controlled with either cautery or sutures as needed. Films showed the mass,  Wire and clip in the specimen.  Gross margins were negative.  After achieving hemostasis, the incision was closed with 3-0 Vicryl, 4-0 Monocryl subcuticular, and Dermabond.  The patient tolerated the procedure well. There were no operative complications. All counts were correct.   EBL: minimal  Dortha Schwalbe, MD, FACS 01/12/2012 3:26 PM

## 2012-01-12 NOTE — Anesthesia Postprocedure Evaluation (Signed)
  Anesthesia Post-op Note  Patient: Martha Bowers  Procedure(s) Performed: Procedure(s) (LRB) with comments: PARTIAL MASTECTOMY WITH NEEDLE LOCALIZATION (Right) - Right breast Needle localization partial mastectomy  Patient Location: PACU  Anesthesia Type: MAC  Level of Consciousness: awake, alert  and oriented  Airway and Oxygen Therapy: Patient Spontanous Breathing  Post-op Pain: mild  Post-op Assessment: Post-op Vital signs reviewed  Post-op Vital Signs: Reviewed  Complications: No apparent anesthesia complications

## 2012-01-26 ENCOUNTER — Ambulatory Visit (INDEPENDENT_AMBULATORY_CARE_PROVIDER_SITE_OTHER): Payer: Medicaid Other | Admitting: Surgery

## 2012-01-26 ENCOUNTER — Encounter (INDEPENDENT_AMBULATORY_CARE_PROVIDER_SITE_OTHER): Payer: Self-pay | Admitting: Surgery

## 2012-01-26 VITALS — BP 140/72 | HR 80 | Temp 97.2°F | Resp 16 | Ht 62.0 in | Wt 169.8 lb

## 2012-01-26 DIAGNOSIS — Z9889 Other specified postprocedural states: Secondary | ICD-10-CM

## 2012-01-26 NOTE — Patient Instructions (Signed)
Return as needed

## 2012-01-26 NOTE — Progress Notes (Signed)
Patient returns after right breast partial mastectomy. Pathology showed RADIAL SCAR WITH FIBROCYSTIC CHANGES SHOWING USUAL DUCTAL HYPERPLASIA  She is doing well.  Exam: Right breast shows well-healed scar without signs of infection or seroma.  Impression: Status post right partial mastectomy  Plan: Return as needed. Resume screening mammogram next year. Resume full activity.

## 2013-03-24 ENCOUNTER — Other Ambulatory Visit (HOSPITAL_COMMUNITY): Payer: Self-pay | Admitting: Family Medicine

## 2013-03-24 DIAGNOSIS — Z1231 Encounter for screening mammogram for malignant neoplasm of breast: Secondary | ICD-10-CM

## 2013-04-10 ENCOUNTER — Ambulatory Visit (HOSPITAL_COMMUNITY)
Admission: RE | Admit: 2013-04-10 | Discharge: 2013-04-10 | Disposition: A | Discharge status: Home / Self Care | Payer: PRIVATE HEALTH INSURANCE | Source: Ambulatory Visit | Attending: Family Medicine | Admitting: Family Medicine

## 2013-04-10 DIAGNOSIS — Z1231 Encounter for screening mammogram for malignant neoplasm of breast: Secondary | ICD-10-CM | POA: Insufficient documentation

## 2014-07-08 ENCOUNTER — Other Ambulatory Visit (HOSPITAL_COMMUNITY): Payer: Self-pay | Admitting: Family Medicine

## 2014-07-08 DIAGNOSIS — Z1231 Encounter for screening mammogram for malignant neoplasm of breast: Secondary | ICD-10-CM

## 2014-07-22 ENCOUNTER — Ambulatory Visit (HOSPITAL_COMMUNITY)
Admission: RE | Admit: 2014-07-22 | Discharge: 2014-07-22 | Disposition: A | Discharge status: Home / Self Care | Payer: Medicare Other | Source: Ambulatory Visit | Attending: Family Medicine | Admitting: Family Medicine

## 2014-07-22 DIAGNOSIS — Z1231 Encounter for screening mammogram for malignant neoplasm of breast: Secondary | ICD-10-CM | POA: Diagnosis present

## 2015-10-04 ENCOUNTER — Other Ambulatory Visit: Payer: Self-pay | Admitting: Family Medicine

## 2015-10-04 DIAGNOSIS — Z1231 Encounter for screening mammogram for malignant neoplasm of breast: Secondary | ICD-10-CM

## 2015-10-12 ENCOUNTER — Ambulatory Visit
Admission: RE | Admit: 2015-10-12 | Discharge: 2015-10-12 | Disposition: A | Discharge status: Home / Self Care | Payer: Medicare Other | Source: Ambulatory Visit | Attending: Family Medicine | Admitting: Family Medicine

## 2015-10-12 DIAGNOSIS — Z1231 Encounter for screening mammogram for malignant neoplasm of breast: Secondary | ICD-10-CM

## 2015-10-13 ENCOUNTER — Other Ambulatory Visit: Payer: Self-pay | Admitting: Family Medicine

## 2015-10-13 DIAGNOSIS — R928 Other abnormal and inconclusive findings on diagnostic imaging of breast: Secondary | ICD-10-CM

## 2015-10-18 ENCOUNTER — Other Ambulatory Visit: Payer: Medicare Other

## 2015-10-19 ENCOUNTER — Other Ambulatory Visit: Payer: Medicare Other

## 2015-10-29 ENCOUNTER — Ambulatory Visit
Admission: RE | Admit: 2015-10-29 | Discharge: 2015-10-29 | Disposition: A | Discharge status: Home / Self Care | Payer: Medicare Other | Source: Ambulatory Visit | Attending: Family Medicine | Admitting: Family Medicine

## 2015-10-29 DIAGNOSIS — R928 Other abnormal and inconclusive findings on diagnostic imaging of breast: Secondary | ICD-10-CM

## 2016-12-25 ENCOUNTER — Other Ambulatory Visit: Payer: Self-pay | Admitting: Family Medicine

## 2016-12-25 DIAGNOSIS — Z1239 Encounter for other screening for malignant neoplasm of breast: Secondary | ICD-10-CM

## 2017-01-10 ENCOUNTER — Ambulatory Visit
Admission: RE | Admit: 2017-01-10 | Discharge: 2017-01-10 | Disposition: A | Discharge status: Home / Self Care | Payer: Medicare Other | Source: Ambulatory Visit | Attending: Family Medicine | Admitting: Family Medicine

## 2017-01-10 DIAGNOSIS — Z1239 Encounter for other screening for malignant neoplasm of breast: Secondary | ICD-10-CM

## 2018-04-12 ENCOUNTER — Other Ambulatory Visit: Payer: Self-pay | Admitting: Family Medicine

## 2018-04-12 ENCOUNTER — Other Ambulatory Visit: Payer: Self-pay | Admitting: Surgery

## 2018-04-12 DIAGNOSIS — Z1231 Encounter for screening mammogram for malignant neoplasm of breast: Secondary | ICD-10-CM

## 2018-04-12 DIAGNOSIS — E213 Hyperparathyroidism, unspecified: Secondary | ICD-10-CM

## 2018-06-07 ENCOUNTER — Ambulatory Visit: Payer: Medicare Other

## 2018-06-07 ENCOUNTER — Other Ambulatory Visit: Payer: Medicare Other

## 2018-08-09 ENCOUNTER — Other Ambulatory Visit: Payer: Medicare Other

## 2018-08-09 ENCOUNTER — Ambulatory Visit: Payer: Medicare Other

## 2018-10-01 ENCOUNTER — Ambulatory Visit
Admission: RE | Admit: 2018-10-01 | Discharge: 2018-10-01 | Disposition: A | Discharge status: Home / Self Care | Payer: Medicare Other | Source: Ambulatory Visit | Attending: Family Medicine | Admitting: Family Medicine

## 2018-10-01 ENCOUNTER — Other Ambulatory Visit: Payer: Self-pay

## 2018-10-01 DIAGNOSIS — E213 Hyperparathyroidism, unspecified: Secondary | ICD-10-CM

## 2018-10-01 DIAGNOSIS — Z1231 Encounter for screening mammogram for malignant neoplasm of breast: Secondary | ICD-10-CM

## 2019-05-18 ENCOUNTER — Ambulatory Visit: Payer: Medicare Other | Attending: Internal Medicine

## 2019-05-18 DIAGNOSIS — Z23 Encounter for immunization: Secondary | ICD-10-CM | POA: Insufficient documentation

## 2019-05-18 NOTE — Progress Notes (Signed)
   Covid-19 Vaccination Clinic  Name:  Martha Bowers    MRN: [id number] DOB: Aug 16, 1955  05/18/2019  [name] was observed post Covid-19 immunization for 15 minutes without incidence. She was provided with Vaccine Information Sheet and instruction to access the V-Safe system.   [name] was instructed to call 911 with any severe reactions post vaccine: Marland Kitchen Difficulty breathing  . Swelling of your face and throat  . A fast heartbeat  . A bad rash all over your body  . Dizziness and weakness    Immunizations Administered    Name Date Dose VIS Date Route   Pfizer COVID-19 Vaccine 05/18/2019  4:07 PM 0.3 mL 02/28/2019 Intramuscular   Manufacturer: [employer], [employer]   Lot: [id number]   NDC: [address]-9

## 2019-06-17 ENCOUNTER — Ambulatory Visit: Payer: Medicare Other | Attending: Internal Medicine

## 2019-06-17 DIAGNOSIS — Z23 Encounter for immunization: Secondary | ICD-10-CM

## 2019-06-17 NOTE — Progress Notes (Signed)
   Covid-19 Vaccination Clinic  Name:  Martha Bowers    MRN: [id number] DOB: [birth year]  06/17/2019  [name] was observed post Covid-19 immunization for 15 minutes without incident. She was provided with Vaccine Information Sheet and instruction to access the V-Safe system.   [name] was instructed to call 911 with any severe reactions post vaccine: Marland Kitchen Difficulty breathing  . Swelling of face and throat  . A fast heartbeat  . A bad rash all over body  . Dizziness and weakness   Immunizations Administered    Name Date Dose VIS Date Route   Pfizer COVID-19 Vaccine 06/17/2019  4:31 PM 0.3 mL 02/28/2019 Intramuscular   Manufacturer: [employer], [employer]   Lot: [id number]   NDC: [address]-7

## 2019-11-11 ENCOUNTER — Other Ambulatory Visit: Payer: Self-pay | Admitting: Family Medicine

## 2019-11-11 DIAGNOSIS — Z1231 Encounter for screening mammogram for malignant neoplasm of breast: Secondary | ICD-10-CM

## 2019-12-04 ENCOUNTER — Ambulatory Visit
Admission: RE | Admit: 2019-12-04 | Discharge: 2019-12-04 | Disposition: A | Discharge status: Home / Self Care | Payer: Medicare Other | Source: Ambulatory Visit | Attending: Family Medicine | Admitting: Family Medicine

## 2019-12-04 ENCOUNTER — Other Ambulatory Visit: Payer: Self-pay

## 2019-12-04 DIAGNOSIS — Z1231 Encounter for screening mammogram for malignant neoplasm of breast: Secondary | ICD-10-CM

## 2021-01-17 ENCOUNTER — Other Ambulatory Visit: Payer: Self-pay | Admitting: Family Medicine

## 2021-01-17 DIAGNOSIS — Z1231 Encounter for screening mammogram for malignant neoplasm of breast: Secondary | ICD-10-CM

## 2021-02-22 ENCOUNTER — Ambulatory Visit
Admission: RE | Admit: 2021-02-22 | Discharge: 2021-02-22 | Disposition: A | Discharge status: Home / Self Care | Payer: Medicare Other | Source: Ambulatory Visit | Attending: Family Medicine | Admitting: Family Medicine

## 2021-02-22 DIAGNOSIS — Z1231 Encounter for screening mammogram for malignant neoplasm of breast: Secondary | ICD-10-CM

## 2021-02-24 ENCOUNTER — Other Ambulatory Visit: Payer: Self-pay | Admitting: Family Medicine

## 2021-02-24 DIAGNOSIS — R928 Other abnormal and inconclusive findings on diagnostic imaging of breast: Secondary | ICD-10-CM

## 2021-04-07 ENCOUNTER — Ambulatory Visit
Admission: RE | Admit: 2021-04-07 | Discharge: 2021-04-07 | Disposition: A | Discharge status: Home / Self Care | Payer: Medicare Other | Source: Ambulatory Visit | Attending: Family Medicine | Admitting: Family Medicine

## 2021-04-07 ENCOUNTER — Other Ambulatory Visit: Payer: Self-pay

## 2021-04-07 DIAGNOSIS — R928 Other abnormal and inconclusive findings on diagnostic imaging of breast: Secondary | ICD-10-CM

## 2022-03-06 ENCOUNTER — Other Ambulatory Visit: Payer: Self-pay | Admitting: Family Medicine

## 2022-03-06 DIAGNOSIS — Z1231 Encounter for screening mammogram for malignant neoplasm of breast: Secondary | ICD-10-CM

## 2022-05-02 ENCOUNTER — Ambulatory Visit
Admission: RE | Admit: 2022-05-02 | Discharge: 2022-05-02 | Disposition: A | Discharge status: Home / Self Care | Payer: 59 | Source: Ambulatory Visit | Attending: Family Medicine | Admitting: Family Medicine

## 2022-05-02 DIAGNOSIS — Z1231 Encounter for screening mammogram for malignant neoplasm of breast: Secondary | ICD-10-CM

## 2022-05-04 ENCOUNTER — Other Ambulatory Visit: Payer: Self-pay | Admitting: Family Medicine

## 2022-05-04 DIAGNOSIS — R928 Other abnormal and inconclusive findings on diagnostic imaging of breast: Secondary | ICD-10-CM

## 2022-05-19 ENCOUNTER — Ambulatory Visit
Admission: RE | Admit: 2022-05-19 | Discharge: 2022-05-19 | Disposition: A | Discharge status: Home / Self Care | Payer: 59 | Source: Ambulatory Visit | Attending: Family Medicine | Admitting: Family Medicine

## 2022-05-19 ENCOUNTER — Ambulatory Visit: Payer: 59

## 2022-05-19 DIAGNOSIS — R928 Other abnormal and inconclusive findings on diagnostic imaging of breast: Secondary | ICD-10-CM

## 2022-05-23 ENCOUNTER — Other Ambulatory Visit: Payer: Self-pay | Admitting: Family Medicine

## 2022-05-23 DIAGNOSIS — R921 Mammographic calcification found on diagnostic imaging of breast: Secondary | ICD-10-CM

## 2022-06-01 ENCOUNTER — Ambulatory Visit
Admission: RE | Admit: 2022-06-01 | Discharge: 2022-06-01 | Disposition: A | Discharge status: Home / Self Care | Payer: 59 | Source: Ambulatory Visit | Attending: Family Medicine | Admitting: Family Medicine

## 2022-06-01 DIAGNOSIS — R921 Mammographic calcification found on diagnostic imaging of breast: Secondary | ICD-10-CM

## 2022-06-01 HISTORY — PX: BREAST BIOPSY: SHX20

## 2022-11-11 IMAGING — MG MM DIGITAL DIAGNOSTIC UNILAT*R* W/ TOMO W/ CAD
4 series · 4 of 12 positions shown · non-contrast
Comparison: Previous exam(s).

CLINICAL DATA: Screening recall for a possible right breast mass.

EXAM:
DIGITAL DIAGNOSTIC UNILATERAL RIGHT MAMMOGRAM WITH TOMOSYNTHESIS AND
CAD; ULTRASOUND RIGHT BREAST LIMITED
TECHNIQUE: Right digital diagnostic mammography and breast tomosynthesis was
performed. The images were evaluated with computer-aided detection.;
Targeted ultrasound examination of the right breast was performed

[R CC synth-2D]
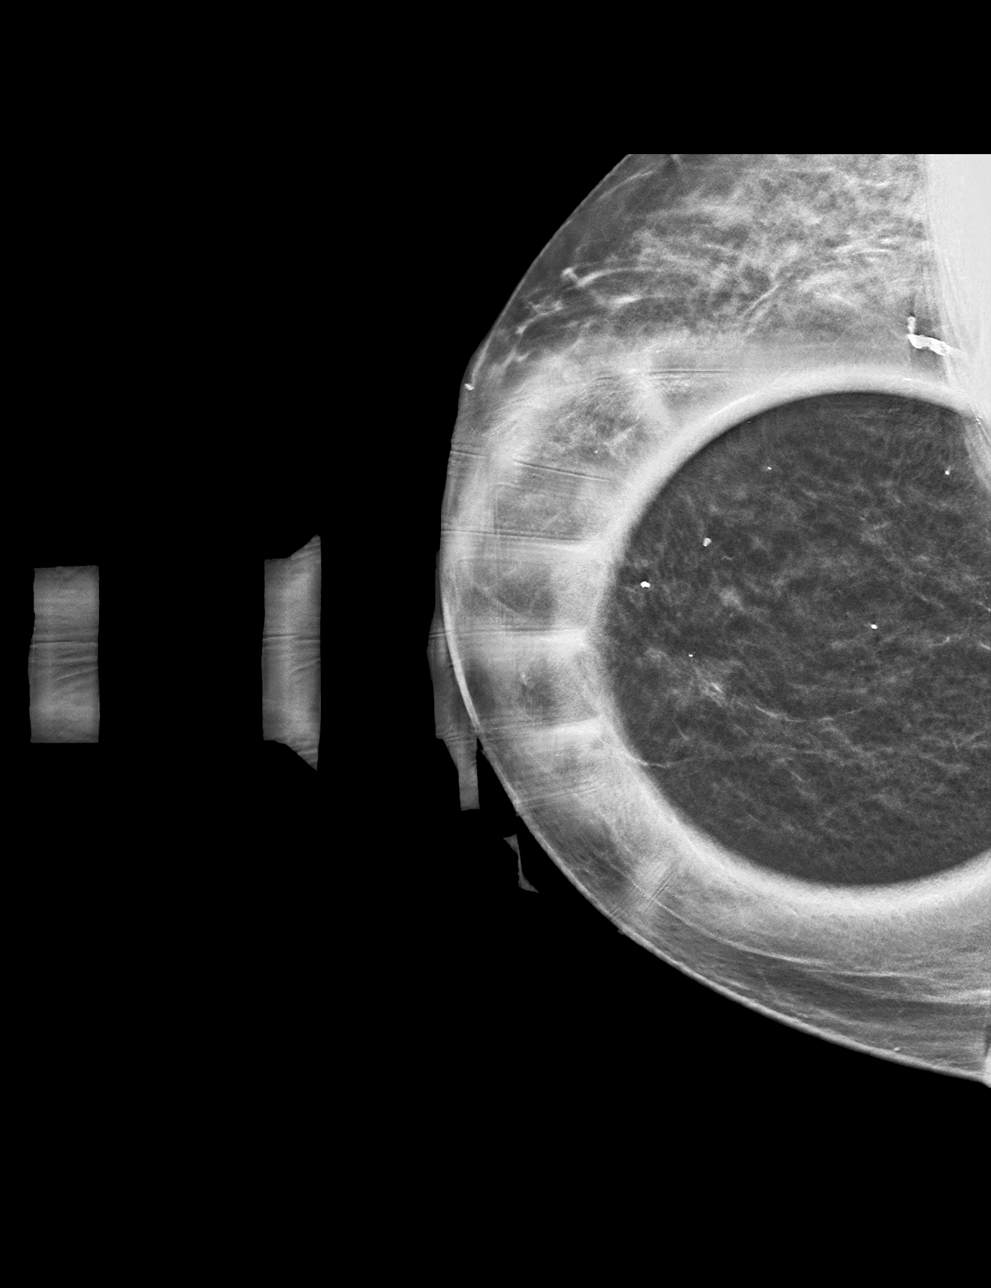

[R ML synth-2D]
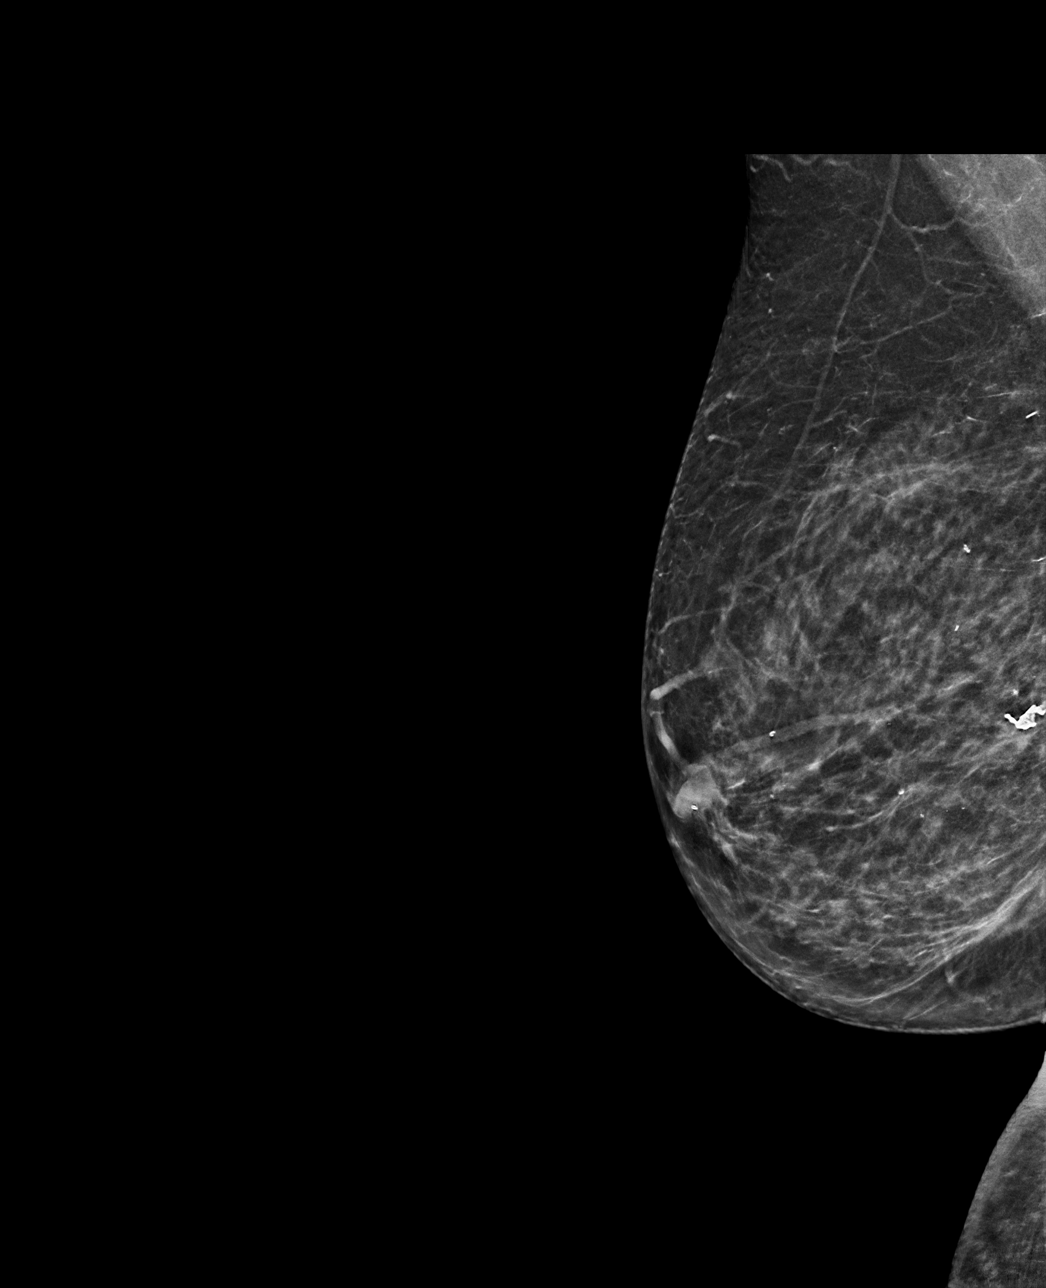

[R ML tomo · tomo slice 32/63.0]
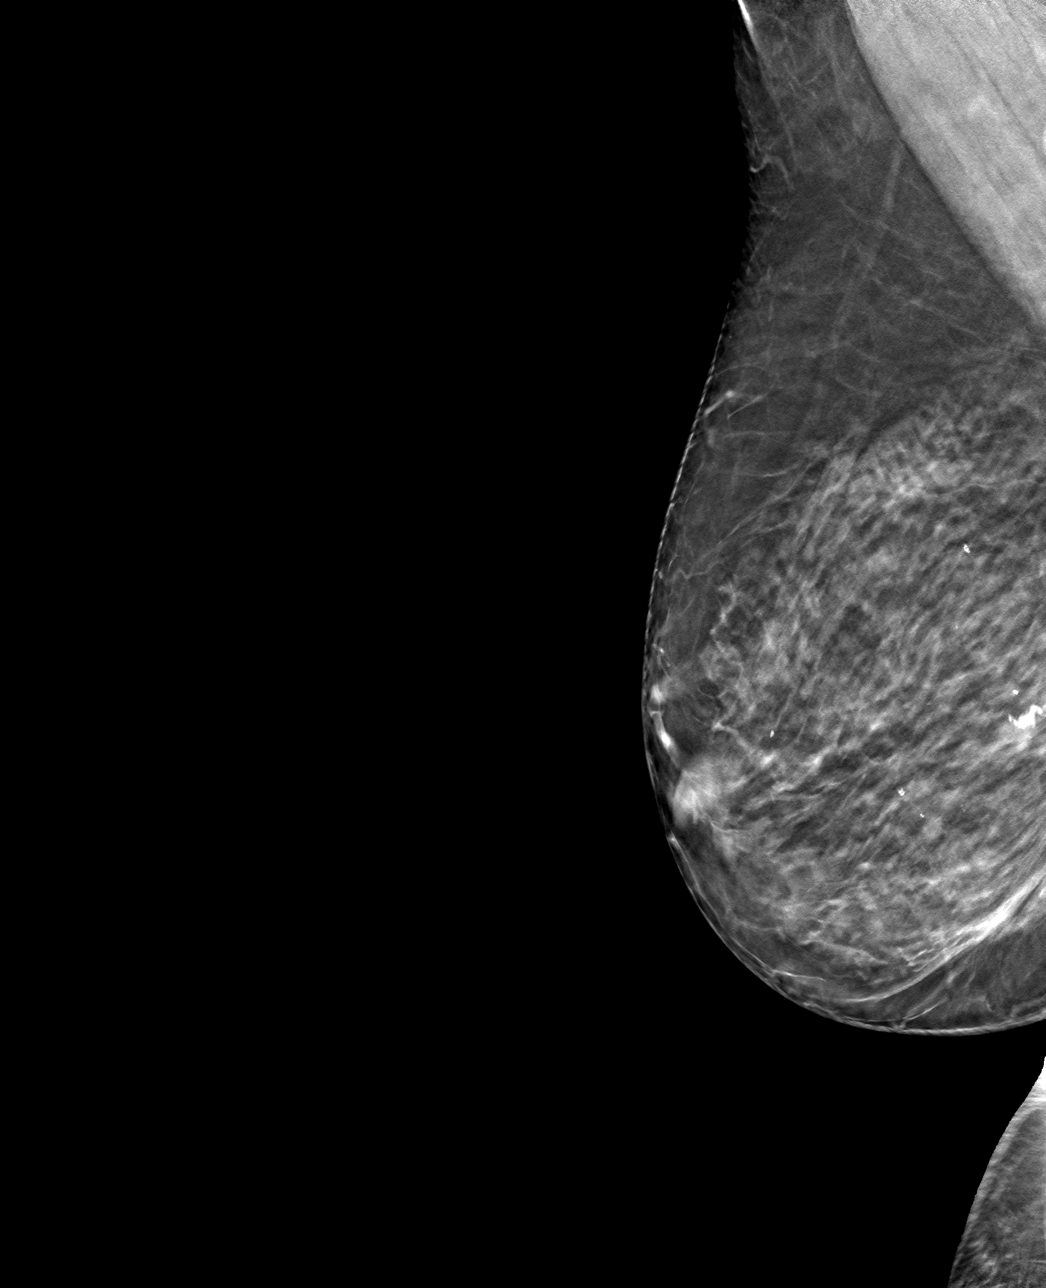

[R CC tomo · tomo slice 22/43.0]
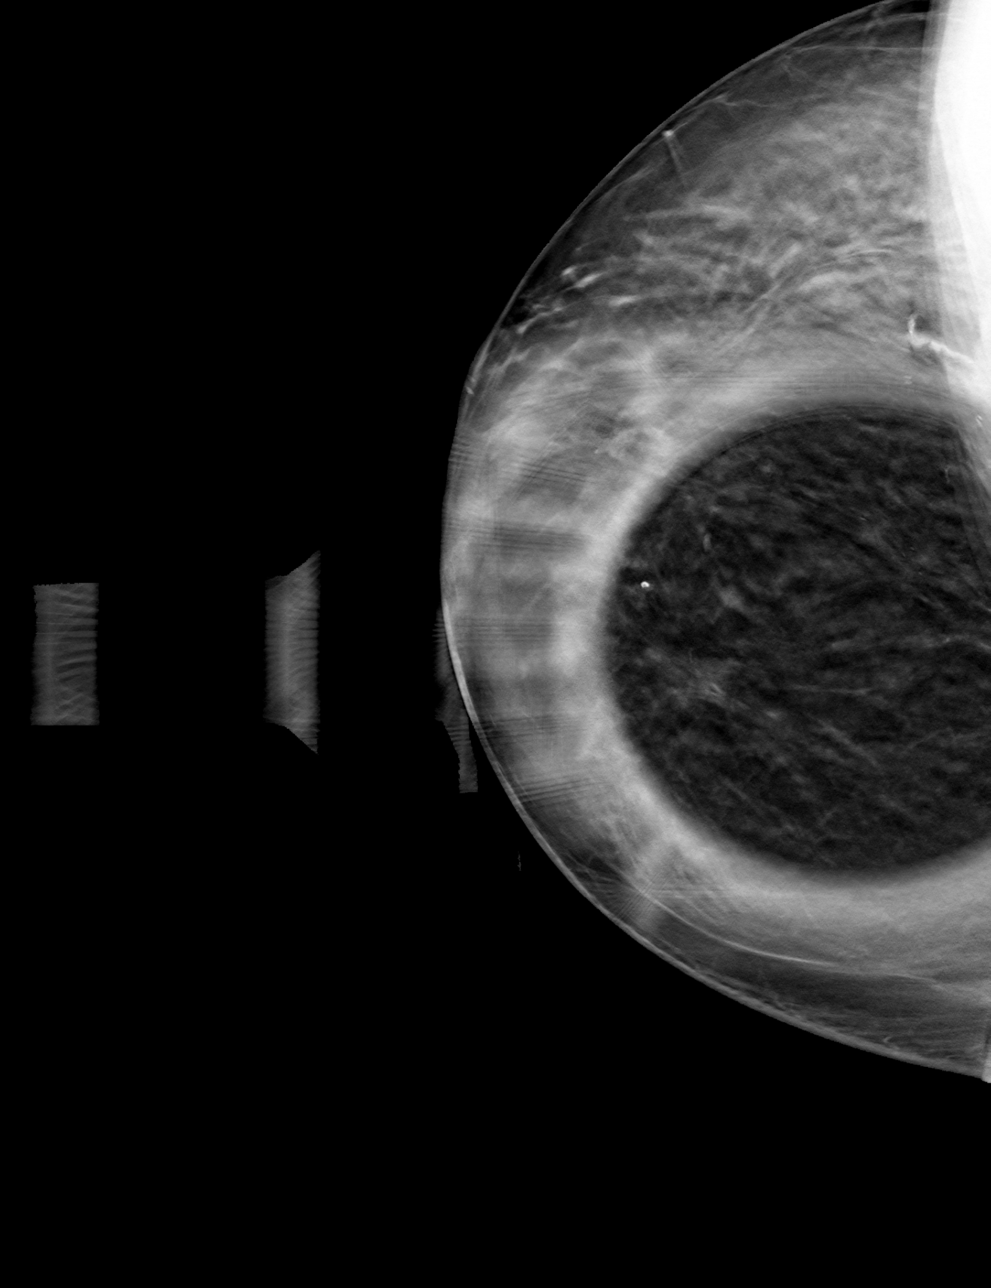

[4 of 12 positions shown; findings below may reference images not displayed]

ACR Breast Density Category b: There are scattered areas of
fibroglandular density.
FINDINGS: Spot compression tomosynthesis images through the central to
slightly medial right breast, middle depth, demonstrates a 3 mm oval
mass with indistinct margins.

Ultrasound of the right breast at 2 o'clock, 2 cm from the nipple
demonstrates an anechoic oval circumscribed mass measuring 3 x 4 x 3
mm. Normal lymph nodes are found in the right axilla.
IMPRESSION: There is a benign 3 mm cyst in the right breast at 2 o'clock,
accounting for the mass found on the screening mammogram.

RECOMMENDATION:
Screening mammogram in one year.(Code:[id number])

I have discussed the findings and recommendations with the patient.
If applicable, a reminder letter will be sent to the patient
regarding the next appointment.

BI-RADS CATEGORY  2: Benign.

## 2023-02-10 LAB — LAB REPORT - SCANNED
EGFR (Non-African Amer.): 51
TSH: 1.75 (ref 0.41–5.90)

## 2023-03-19 ENCOUNTER — Other Ambulatory Visit: Payer: Self-pay | Admitting: Family Medicine

## 2023-03-19 DIAGNOSIS — R921 Mammographic calcification found on diagnostic imaging of breast: Secondary | ICD-10-CM

## 2023-04-24 ENCOUNTER — Ambulatory Visit: Payer: 59 | Attending: Cardiology | Admitting: Cardiology

## 2023-04-24 ENCOUNTER — Encounter: Payer: Self-pay | Admitting: Cardiology

## 2023-04-24 VITALS — BP 140/87 | HR 108 | Resp 16 | Ht 62.0 in | Wt 178.2 lb

## 2023-04-24 DIAGNOSIS — I10 Essential (primary) hypertension: Secondary | ICD-10-CM

## 2023-04-24 DIAGNOSIS — I5032 Chronic diastolic (congestive) heart failure: Secondary | ICD-10-CM | POA: Diagnosis not present

## 2023-04-24 DIAGNOSIS — I4719 Other supraventricular tachycardia: Secondary | ICD-10-CM | POA: Diagnosis not present

## 2023-04-24 DIAGNOSIS — E7800 Pure hypercholesterolemia, unspecified: Secondary | ICD-10-CM

## 2023-04-24 MED ORDER — METOPROLOL SUCCINATE ER 25 MG PO TB24: 25.0000 mg | ORAL_TABLET | Freq: Every day | ORAL | 2 refills | Status: DC

## 2023-04-24 MED ORDER — LOSARTAN POTASSIUM-HCTZ 50-12.5 MG PO TABS: 1.0000 | ORAL_TABLET | Freq: Every day | ORAL | 6 refills | Status: DC

## 2023-04-24 NOTE — Patient Instructions (Signed)
 Medication Instructions:  Your physician has recommended you make the following change in your medication:  Stop hydrochlorothiazide Start Metoprolol  Succinate 25 mg by mouth daily  Start Losartan  hydrochlorothiazide 50/12.5 mg daily    *If you need a refill on your cardiac medications before your next appointment, please call your pharmacy*   Lab Work: Have lab work checked in about 6 weeks (before next office visit)--BMP.  This can be done at any [facility].  There is an office on the first floor of our building If you have labs (blood work) drawn today and your tests are completely normal, you will receive your results only by: MyChart Message (if you have MyChart) OR A paper copy in the mail If you have any lab test that is abnormal or we need to change your treatment, we will call you to review the results.   Testing/Procedures: Your physician has requested that you have an echocardiogram. Echocardiography is a painless test that uses sound waves to create images of your heart. It provides your doctor with information about the size and shape of your heart and how well your heart's chambers and valves are working. This procedure takes approximately one hour. There are no restrictions for this procedure. Please do NOT wear cologne, perfume, aftershave, or lotions (deodorant is allowed). Please arrive 15 minutes prior to your appointment time.  Please note: We ask at that you not bring children with you during ultrasound (echo/ vascular) testing. Due to room size and safety concerns, children are not allowed in the ultrasound rooms during exams. Our front office staff cannot provide observation of children in our lobby area while testing is being conducted. An adult accompanying a patient to their appointment will only be allowed in the ultrasound room at the discretion of the ultrasound technician under special circumstances. We apologize for any inconvenience.    Follow-Up: At [facility], you and your health needs are our priority.  As part of our continuing mission to provide you with exceptional heart care, we have created designated Provider Care Teams.  These Care Teams include your primary Cardiologist (physician) and Advanced Practice Providers (APPs -  Physician Assistants and Nurse Practitioners) who all work together to provide you with the care you need, when you need it.  We recommend signing up for the patient portal called MyChart.  Sign up information is provided on this After Visit Summary.  MyChart is used to connect with patients for Virtual Visits (Telemedicine).  Patients are able to view lab/test results, encounter notes, upcoming appointments, etc.  Non-urgent messages can be sent to your provider as well.   To learn more about what you can do with MyChart, go to [email or web address].    Your next appointment:   6-8 week(s)  Provider:   Orren Fabry, PA-C, Dayna Dunn, PA-C, Jackee Alberts, NP, Olivia Pavy, PA-C, Rosaline Bane, NP, Glendia Ferrier, PA-C, or Artist Pouch, PA-C         Other Instructions

## 2023-04-24 NOTE — Progress Notes (Signed)
 Cardiology Office Note:  .   Date:  04/24/2023  ID:  Martha Bowers, DOB 1955/09/18, MRN [id number] PCP: Loring Tanda Mae, MD  [facility] Providers Cardiologist:  Gordy Bergamo, MD   History of Present Illness: .   Martha Bowers is a 68 y.o.   Discussed the use of AI scribe software for clinical note transcription with the patient, who gave verbal consent to proceed.  History of Present Illness   Martha Bowers is a 68 year old female who presents for cardiology evaluation due to abnormal EKG findings with?  Atrial fibrillation and also elevated BNP noted by her PCP. She is accompanied by her sisters, including Arland Robins.  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 She does not engage in much physical activity, primarily moving around the house to the bathroom or mailbox.  She has been experiencing swelling in her ankles, feet, and legs for several months. Her family doctor initiated treatment with hydrochlorothiazide and rosuvastatin two to three months ago. Despite this, the swelling has not significantly improved, initially seeming to get better but then worsening again, particularly in one ankle.  Symptoms described as very mild.  No chest pain or dyspnea.  No dizziness or syncope or palpitations.  Her weight has been stable recently, although she has lost weight, dropping from 188 pounds to 169 pounds over the past month and a half. This weight loss was achieved by reducing her intake of chips, sodas, and fried foods, and increasing her consumption of fruits and vegetables.   Labs   External Labs:  PCP Labs 12/26/2022:  Hb 11.5/HCT 36.0, platelets 210, normal indicis.  Serum glucose 99 mg, BUN 24, creatinine 1.17, EGFR 51 mL, potassium 4.3, LFTs normal.  Total cholesterol 202, triglycerides 94, HDL 79, LDL 106.  A1c 5.7%.  TSH elevated at 9.070.  Vitamin D  94.  BNP 159, mildly elevated.  Review of Systems  Cardiovascular:  Positive for leg swelling. Negative for chest pain and dyspnea on exertion.    Physical Exam:   VS:  BP (!) 140/87 (BP Location: Left Arm, Patient Position: Sitting, Cuff Size: Normal)   Pulse (!) 108   Resp 16   Ht 5' 2 (1.575 m)   Wt 178 lb 3.2 oz (80.8 kg)   SpO2 96%   BMI 32.59 kg/m    Wt Readings from Last 3 Encounters:  04/24/23 178 lb 3.2 oz (80.8 kg)  01/26/12 169 lb 12.8 oz (77 kg)  01/12/12 170 lb (77.1 kg)    Physical Exam Neck:     Vascular: No carotid bruit or JVD.  Cardiovascular:     Rate and Rhythm: Normal rate. Rhythm irregular.     Pulses: Normal pulses and intact distal pulses.     Heart sounds: No murmur heard. Pulmonary:     Effort: Pulmonary effort is normal.     Breath sounds: Normal breath sounds.  Abdominal:     General: Bowel sounds are normal.     Palpations: Abdomen is soft.  Musculoskeletal:     Right lower leg: No edema.     Left lower leg: Edema (trace) present.  Skin:    Capillary Refill: Capillary refill takes less than 2 seconds.     Studies Reviewed: .    NA  EKG:    EKG Interpretation Date/Time:  Tuesday April 24 2023 14:06:02 EST Ventricular Rate:  115 PR Interval:    QRS  Duration:  74 QT Interval:  306 QTC Calculation: 423 R Axis:   24  Text Interpretation: EKG 04/24/2023: Multifocal atrial tachycardia at the rate of 115 bpm, normal axis, incomplete right bundle branch block.  Aberrantly conducted beats with RBBB (2).  Nonspecific T abnormality. Confirmed by Tahjay Binion, Jagadeesh [phone]) on 04/24/2023 2:18:35 PM    PCP EKG 02/07/2023: Normal sinus rhythm at the rate of 95 bpm, biatrial enlargement, LVH by voltage.  Frequent PACs and atrial triplets and couplets.  Consider multifocal atrial rhythm.  Nonspecific ST-T abnormality.  Medications and allergies    No Known Allergies   Current Outpatient Medications:    calcium carbonate (OSCAL) 1500 (600  Ca) MG TABS tablet, Take 600 mg of elemental calcium by mouth 2 (two) times daily with a meal., Disp: , Rfl:    diltiazem (CARDIZEM CD) 120 MG 24 hr capsule, Take 120 mg by mouth daily., Disp: , Rfl:    LEVOTHYROXINE SODIUM PO, Take by mouth daily., Disp: , Rfl:    losartan -hydrochlorothiazide (HYZAAR) 50-12.5 MG tablet, Take 1 tablet by mouth daily., Disp: 30 tablet, Rfl: 6   metoprolol  succinate (TOPROL -XL) 25 MG 24 hr tablet, Take 1 tablet (25 mg total) by mouth daily. Take with or immediately following a meal., Disp: 30 tablet, Rfl: 2   rosuvastatin (CRESTOR) 5 MG tablet, Take 5 mg by mouth daily., Disp: , Rfl:    ASSESSMENT AND PLAN: .      ICD-10-CM   1. Chronic diastolic heart failure (HCC)  P49.67 ECHOCARDIOGRAM COMPLETE    Basic Metabolic Panel (BMET)    2. Multifocal atrial tachycardia (HCC)  I47.19 EKG 12-Lead    metoprolol  succinate (TOPROL -XL) 25 MG 24 hr tablet    ECHOCARDIOGRAM COMPLETE    Basic Metabolic Panel (BMET)    3. Hypercholesteremia  E78.00     4. Primary hypertension  I10 metoprolol  succinate (TOPROL -XL) 25 MG 24 hr tablet    ECHOCARDIOGRAM COMPLETE    Basic Metabolic Panel (BMET)     Assessment and Plan    Multifocal Atrial Tachycardia Abnormal EKG showing multifocal atrial tachycardia, likely due to non-cardiac issues such as electrolyte imbalances or underlying conditions like COPD or lung disease including obesity hypoventilation. No significant symptoms reported. Emphasized weight loss to improve heart health and reduce symptoms. - Stop hydrochlorothiazide - Start metoprolol  succinate 25 mg once daily - Start losartan  HCT 50/12.5 mg once daily in the morning for blood pressure control and also to improve diastolic dysfunction as her BNP was elevated send history of chronic diastolic heart failure - Order echocardiogram - Follow-up with nurse practitioner or PA in 6-8 weeks - Order BMP in 6 weeks prior to follow-up.  Hypertension Elevated blood  pressure noted, likely contributing to mild leg edema and potential kidney issues. Discussed the need for regular blood pressure monitoring and the benefits of losartan  HCT in managing hypertension and protecting kidney function. - Start losartan  HCT 50/12.5 mg once daily in the morning - Monitor blood pressure regularly  Mild Leg Edema Mild swelling in one leg, likely related to obesity and hypertension. Discussed the importance of addressing underlying causes such as obesity and hypertension to manage edema.  Suspect edema is probably from dependent edema and she also has large adipose tissue.  Mild chronic diastolic heart failure could be contributing as well. - Address underlying causes such as obesity and hypertension, poor diet.  Fortunately over the past 2 months, patient has lost significant amount of weight by 20 pounds with improvement  in right leg edema.  Obesity BMI of 32, with a weight loss of approximately 20 pounds noted. Obesity contributing to heart issues and mild leg edema. Discussed dietary modifications, reducing calorie intake, avoiding high-calorie snacks, and limiting zero-sugar sodas. Emphasized family support in adopting healthy eating habits. - Continue dietary modifications to reduce weight - Encourage consumption of raw vegetables without high-calorie dressings - Avoid fried foods and high-sodium foods - Limit intake of zero-sugar sodas and other diet drinks  Mild hypercholesterolemia Patient has been started on low-dose of Crestor 5 mg daily which is appropriate.  Suspect even hypertension will improve with weight loss.  Suspect diastolic dysfunction will also improve.  She is nondiabetic and non-smoker.  Goal LDL <100.  Follow-up - Follow-up with nurse practitioner or PA in 6-8 weeks - Order BMP in 6 weeks prior to follow-up If stable and asymptomatic, we will see her back on a as needed basis.            Signed,  Gordy Bergamo, MD, [facility] 04/24/2023, 5:08  PM [facility] [address] [place], [place] [address] Phone: [phone]. Fax:  [phone]

## 2023-05-04 ENCOUNTER — Ambulatory Visit
Admission: RE | Admit: 2023-05-04 | Discharge: 2023-05-04 | Disposition: A | Discharge status: Home / Self Care | Payer: 59 | Source: Ambulatory Visit | Attending: Family Medicine | Admitting: Family Medicine

## 2023-05-04 DIAGNOSIS — R921 Mammographic calcification found on diagnostic imaging of breast: Secondary | ICD-10-CM

## 2023-05-16 ENCOUNTER — Ambulatory Visit (HOSPITAL_COMMUNITY): Payer: 59 | Attending: Cardiology

## 2023-05-16 DIAGNOSIS — I10 Essential (primary) hypertension: Secondary | ICD-10-CM | POA: Diagnosis present

## 2023-05-16 DIAGNOSIS — I4719 Other supraventricular tachycardia: Secondary | ICD-10-CM | POA: Insufficient documentation

## 2023-05-16 DIAGNOSIS — I5032 Chronic diastolic (congestive) heart failure: Secondary | ICD-10-CM

## 2023-05-16 LAB — ECHOCARDIOGRAM COMPLETE
Area-P 1/2: 4.83 cm2
S' Lateral: 3.3 cm

## 2023-05-16 MED ORDER — PERFLUTREN LIPID MICROSPHERE
1.0000 mL | INTRAVENOUS | Status: AC | PRN
  Administered 2023-05-16: 1 mL via INTRAVENOUS

## 2023-05-20 ENCOUNTER — Encounter: Payer: Self-pay | Admitting: Cardiology

## 2023-05-20 NOTE — Progress Notes (Signed)
 Essentially normal echocardiogram.

## 2023-05-30 LAB — BASIC METABOLIC PANEL
BUN/Creatinine Ratio: 20 (ref 12–28)
BUN: 29 mg/dL — ABNORMAL HIGH (ref 8–27)
CO2: 27 mmol/L (ref 20–29)
Calcium: 8.5 mg/dL — ABNORMAL LOW (ref 8.7–10.3)
Chloride: 98 mmol/L (ref 96–106)
Creatinine, Ser: 1.43 mg/dL — ABNORMAL HIGH (ref 0.57–1.00)
Glucose: 101 mg/dL — ABNORMAL HIGH (ref 70–99)
Potassium: 4.3 mmol/L (ref 3.5–5.2)
Sodium: 140 mmol/L (ref 134–144)
eGFR: 40 mL/min/{1.73_m2} — ABNORMAL LOW (ref >59)

## 2023-05-31 ENCOUNTER — Encounter: Payer: Self-pay | Admitting: Cardiology

## 2023-05-31 NOTE — Progress Notes (Signed)
 I have discontinued hydrochlorothiazide and initiated losartan HCT 50/12.5 mg daily, please have her reduce the dose to 1/2 tablet of losartan HCT, will recheck BMP in about 2 weeks.

## 2023-06-10 NOTE — Progress Notes (Unsigned)
 Cardiology Office Note:  .   Date:  06/11/2023  ID:  Martha Bowers, DOB 06/29/1955, MRN [id number] PCP: Kaleen Mask, MD   [facility] Providers Cardiologist:  Yates Decamp, MD {  History of Present Illness: .   Martha Bowers is a 68 y.o. female with a past medical history of atrial fibrillation and elevated BNP recently referred from her primary care.  She is here for follow-up appointment but was recently seen in the office with Dr. Jacinto Halim on 04/24/23.  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  She does not engage in much physical activity primarily moves around the house to the bathroom or to the mailbox.  Patient has been experiencing swelling in her ankles, feet, and legs for several months.  Her family doctor initiated treatment with HCTZ and rosuvastatin 2 to 3 months prior to her last appointment.  Despite this the swelling has not significantly improved.  Initially seem to get better but then worse again particularly in 1 ankle.  Symptoms described as very mild.  No chest pain or dyspnea.  No dizziness or syncope.  No palpitations.  Weight has been stable although has lost weight dropping from 188 pounds to 169 pounds over the past month and a half.  Weight loss was achieved by reducing her intake of chips, sodas, and fried foods.  Increasing her consumptions of fruit and vegetables.  Today, she presents with a history of afib, HTN, and LE edema with persistent leg swelling and weight fluctuations. Despite changes in medication, including the addition of a diuretic, the swelling has not subsided. The patient reports limited physical activity due to a fear of falling. The patient's diet is low in sodium and she has stopped drinking sweet tea. The patient's blood pressure is well-controlled on a regimen of Hyzaar, diltiazem, and metoprolol. The patient's kidney function is slightly above  normal, with a creatinine level of 1.4. The patient's heart function has been tested and found to be normal (echo reviewed with her today).  Reports no shortness of breath nor dyspnea on exertion. Reports no chest pain, pressure, or tightness. No orthopnea, PND. Reports no palpitations.   Discussed the use of AI scribe software for clinical note transcription with the patient, who gave verbal consent to proceed.  ROS: pertinent ROS in HPI  Studies Reviewed: .       Echo 05/16/23 IMPRESSIONS     1. Left ventricular ejection fraction, by estimation, is 60 to 65%. The  left ventricle has normal function. The left ventricle has no regional  wall motion abnormalities. Left ventricular diastolic parameters were  normal.   2. Right ventricular systolic function is normal. The right ventricular  size is normal. There is normal pulmonary artery systolic pressure.   3. A small pericardial effusion is present. The pericardial effusion is  posterior to the left ventricle.   4. The mitral valve is degenerative. Trivial mitral valve regurgitation.  No evidence of mitral stenosis.   5. The aortic valve is tricuspid. Aortic valve regurgitation is not  visualized. No aortic stenosis is present.   Comparison(s): No prior Echocardiogram.   FINDINGS   Left Ventricle: Left ventricular ejection fraction, by estimation, is 60  to 65%. The left ventricle has normal function. The left ventricle has no  regional wall motion abnormalities. Strain imaging was not performed. The  left ventricular internal cavity   size was normal in size. There is no  left ventricular hypertrophy. Left  ventricular diastolic parameters were normal.   Right Ventricle: The right ventricular size is normal. No increase in  right ventricular wall thickness. Right ventricular systolic function is  normal. There is normal pulmonary artery systolic pressure. The tricuspid  regurgitant velocity is 2.04 m/s, and   with an assumed  right atrial pressure of 3 mmHg, the estimated right  ventricular systolic pressure is 19.6 mmHg.   Left Atrium: Left atrial size was normal in size.   Right Atrium: Right atrial size was normal in size.   Pericardium: A small pericardial effusion is present. The pericardial  effusion is posterior to the left ventricle.   Mitral Valve: The mitral valve is degenerative in appearance. Mild mitral  annular calcification. Trivial mitral valve regurgitation. No evidence of  mitral valve stenosis.   Tricuspid Valve: The tricuspid valve is grossly normal. Tricuspid valve  regurgitation is trivial. No evidence of tricuspid stenosis.   Aortic Valve: The aortic valve is tricuspid. Aortic valve regurgitation is  not visualized. No aortic stenosis is present.   Pulmonic Valve: The pulmonic valve was not well visualized. Pulmonic valve  regurgitation is trivial. No evidence of pulmonic stenosis.   Aorta: The aortic root and ascending aorta are structurally normal, with  no evidence of dilitation.   Venous: The inferior vena cava was not well visualized.   IAS/Shunts: The interatrial septum was not well visualized.   Additional Comments: 3D imaging was not performed.      Physical Exam:   VS:  BP 120/64   Pulse 76   Ht 5\' 2"  (1.575 m)   Wt 184 lb 3.2 oz (83.6 kg)   SpO2 99%   BMI 33.69 kg/m    Wt Readings from Last 3 Encounters:  06/11/23 184 lb 3.2 oz (83.6 kg)  04/24/23 178 lb 3.2 oz (80.8 kg)  01/26/12 169 lb 12.8 oz (77 kg)    GEN: Well nourished, well developed in no acute distress NECK: No JVD; No carotid bruits CARDIAC: RRR, no murmurs, rubs, gallops RESPIRATORY:  Clear to auscultation without rales, wheezing or rhonchi  ABDOMEN: Soft, non-tender, non-distended EXTREMITIES:  1+ nonpitting edema bilaterally; No deformity   ASSESSMENT AND PLAN: .    Peripheral edema Chronic leg swelling likely due to venous stasis. Echocardiogram normal with small pericardial effusion.  No DVT on ultrasound. Low sodium diet and limited activity may contribute to fluid retention. Venous valve incompetence with aging may lead to fluid accumulation. - Recommend compression stockings from [employer] in [place]. - Advise leg elevation when seated. - Encourage low sodium diet. - Consider as-needed diuretic if weight increases by 2-3 pounds overnight or 5 pounds in a week, pending kidney function results (will contact PCP)  Chronic kidney disease Slightly elevated creatinine at 1.4, possibly baseline. Confirm baseline kidney function before adjusting diuretic therapy. - Request recent lab work from primary care provider to assess baseline kidney function.  Hypertension Blood pressure well-controlled at 120/64 on current regimen including losartan HCTZ, diltiazem, and metoprolol.  Hyperlipidemia Managed with rosuvastatin. Recent cholesterol levels likely monitored by primary care. - Request recent cholesterol levels from primary care provider.  Fall risk Limited mobility and fear of falling due to previous falls. Discussed potential use of a cane or walker for safety, especially on uneven ground. - Recommend use of assistive device such as a cane or walker when outside the home.  Follow-up Reassessment of fluid status and kidney function needed. - Schedule follow-up appointment with Dr.  Ganji in 3-4 months.      Signed, Sharlene Dory, PA-C

## 2023-06-11 ENCOUNTER — Ambulatory Visit: Payer: 59 | Attending: Physician Assistant | Admitting: Physician Assistant

## 2023-06-11 ENCOUNTER — Encounter: Payer: Self-pay | Admitting: Physician Assistant

## 2023-06-11 VITALS — BP 120/64 | HR 76 | Ht 62.0 in | Wt 184.2 lb

## 2023-06-11 DIAGNOSIS — E7800 Pure hypercholesterolemia, unspecified: Secondary | ICD-10-CM

## 2023-06-11 DIAGNOSIS — I5032 Chronic diastolic (congestive) heart failure: Secondary | ICD-10-CM | POA: Diagnosis not present

## 2023-06-11 DIAGNOSIS — I10 Essential (primary) hypertension: Secondary | ICD-10-CM

## 2023-06-11 DIAGNOSIS — I4719 Other supraventricular tachycardia: Secondary | ICD-10-CM

## 2023-06-11 DIAGNOSIS — M7989 Other specified soft tissue disorders: Secondary | ICD-10-CM

## 2023-06-11 NOTE — Patient Instructions (Signed)
 Medication Instructions:  Your physician recommends that you continue on your current medications as directed. Please refer to the Current Medication list given to you today.  *If you need a refill on your cardiac medications before your next appointment, please call your pharmacy*   Lab Work: We will be requesting labs from your PCP If you have labs (blood work) drawn today and your tests are completely normal, you will receive your results only by: MyChart Message (if you have MyChart) OR A paper copy in the mail If you have any lab test that is abnormal or we need to change your treatment, we will call you to review the results.   Testing/Procedures: NONE   Follow-Up: At [facility], you and your health needs are our priority.  As part of our continuing mission to provide you with exceptional heart care, we have created designated Provider Care Teams.  These Care Teams include your primary Cardiologist (physician) and Advanced Practice Providers (APPs -  Physician Assistants and Nurse Practitioners) who all work together to provide you with the care you need, when you need it.  We recommend signing up for the patient portal called "MyChart".  Sign up information is provided on this After Visit Summary.  MyChart is used to connect with patients for Virtual Visits (Telemedicine).  Patients are able to view lab/test results, encounter notes, upcoming appointments, etc.  Non-urgent messages can be sent to your provider as well.   To learn more about what you can do with MyChart, go to [email or web address].    Your next appointment:   3-4 months  Provider:   Jacinto Halim, MD  Other Instructions Stick to a low sodium diet. Elevate legs as able to. Compression stockings.

## 2023-07-16 ENCOUNTER — Other Ambulatory Visit: Payer: Self-pay | Admitting: Cardiology

## 2023-07-16 DIAGNOSIS — I10 Essential (primary) hypertension: Secondary | ICD-10-CM

## 2023-07-16 DIAGNOSIS — I4719 Other supraventricular tachycardia: Secondary | ICD-10-CM

## 2023-09-05 NOTE — Progress Notes (Deleted)
 Consider for the [employer] trial with retatrutide for patients with CKD and BMI over 27.

## 2023-09-12 ENCOUNTER — Ambulatory Visit: Attending: Cardiology | Admitting: Cardiology

## 2023-09-12 ENCOUNTER — Encounter: Payer: Self-pay | Admitting: Cardiology

## 2023-09-12 VITALS — BP 119/81 | HR 78 | Resp 16 | Ht 62.0 in | Wt 187.0 lb

## 2023-09-12 DIAGNOSIS — I10 Essential (primary) hypertension: Secondary | ICD-10-CM

## 2023-09-12 DIAGNOSIS — I4719 Other supraventricular tachycardia: Secondary | ICD-10-CM | POA: Diagnosis not present

## 2023-09-12 DIAGNOSIS — I5032 Chronic diastolic (congestive) heart failure: Secondary | ICD-10-CM

## 2023-09-12 NOTE — Patient Instructions (Signed)

## 2023-09-12 NOTE — Progress Notes (Signed)
 Cardiology Office Note:  .   Date:  09/12/2023  ID:  Martha Bowers, DOB 04-24-1955, MRN [id number] PCP: Loring Tanda Mae, MD  [facility] Providers Cardiologist:  Gordy Bergamo, MD   History of Present Illness: .   Martha Bowers is a 68 y.o. Caucasian female with mild obesity, hypertension with stage IIIa chronic kidney disease, hypercholesterolemia, sedentary lifestyle, initially referred for evaluation of possible atrial fibrillation and dyspnea on exertion with elevated BNP.  She is special needs patient with mental retardation. She had multifocal atrial tachycardia by EKG on 04/24/2023 and frequent PACs and PVCs by PCP EKG as well.  She was started on metoprolol  succinate for rate control and also HCTZ by her PCP.  She has been losing weight and has lost about 20 pounds in the last 2 to 3 months.  She now presents for a 75-month follow-up.  Discussed the use of AI scribe software for clinical note transcription with the patient, who gave verbal consent to proceed.  History of Present Illness Martha Bowers is a 68 year old female with obesity and hypertension who presents for cardiovascular evaluation. She is accompanied by her sister.  She is on losartan  HCT and diltiazem CD 120 mg once daily for hypertension, with blood pressure controlled at 118/81 mmHg. She has a history of irregular heartbeats, previously considered atrial fibrillation, now identified as extra heartbeats. She occasionally experiences leg swelling. Obesity is present, with recent weight loss of 20 pounds followed by a regain of 8 pounds. Her diet includes cheese, candy, orange juice, and milk, with reduced soda intake. She exercises minimally due to a fear of falling.  Labs   Lab Results  Component Value Date   NA 140 05/30/2023   K 4.3 05/30/2023   CO2 27 05/30/2023   GLUCOSE 101 (H) 05/30/2023   BUN 29 (H) 05/30/2023   CREATININE 1.43 (H) 05/30/2023   CALCIUM 8.5 (L) 05/30/2023   EGFR  40 (L) 05/30/2023   GFRNONAA 51 02/10/2023      Latest Ref Rng & Units 05/30/2023   10:08 AM 01/10/2012    9:00 AM  BMP  Glucose 70 - 99 mg/dL 898  892   BUN 8 - 27 mg/dL 29  13   Creatinine 9.42 - 1.00 mg/dL 8.56  9.01   BUN/Creat Ratio 12 - 28 20    Sodium 134 - 144 mmol/L 140  140   Potassium 3.5 - 5.2 mmol/L 4.3  4.0   Chloride 96 - 106 mmol/L 98  98   CO2 20 - 29 mmol/L 27  28   Calcium 8.7 - 10.3 mg/dL 8.5  6.5       Latest Ref Rng & Units 01/12/2012    2:00 PM  CBC  WBC 4.0 - 10.5 K/uL 9.8   Hemoglobin 12.0 - 15.0 g/dL 88.1   Hematocrit 63.9 - 46.0 % 35.2   Platelets 150 - 400 K/uL 209    No results found for: HGBA1C  Lab Results  Component Value Date   TSH 1.75 02/10/2023    External Labs:  PCP Labs 12/26/2022:   Hb 11.5/HCT 36.0, platelets 210, normal indicis.   Serum glucose 99 mg, BUN 24, creatinine 1.17, EGFR 51 mL, potassium 4.3, LFTs normal.   Total cholesterol 202, triglycerides 94, HDL 79, LDL 106.   A1c 5.7%.  TSH elevated at 9.070.  Vitamin D 94.   BNP 159, mildly elevated.  ROS  Review of Systems  Cardiovascular:  Negative for  chest pain, dyspnea on exertion and leg swelling.    Physical Exam:   VS:  BP 119/81 (BP Location: Left Arm, Patient Position: Sitting, Cuff Size: Large)   Pulse 78   Resp 16   Ht 5' 2 (1.575 m)   Wt 187 lb (84.8 kg)   SpO2 96%   BMI 34.20 kg/m    Wt Readings from Last 3 Encounters:  09/12/23 187 lb (84.8 kg)  06/11/23 184 lb 3.2 oz (83.6 kg)  04/24/23 178 lb 3.2 oz (80.8 kg)    Physical Exam Constitutional:      Appearance: She is obese.  Neck:     Vascular: No carotid bruit or JVD.   Cardiovascular:     Rate and Rhythm: Normal rate and regular rhythm.     Pulses: Intact distal pulses.     Heart sounds: Normal heart sounds. No murmur heard.    No gallop.  Pulmonary:     Effort: Pulmonary effort is normal.     Breath sounds: Normal breath sounds.  Abdominal:     General: Bowel sounds are normal.      Palpations: Abdomen is soft.   Musculoskeletal:     Right lower leg: No edema.     Left lower leg: No edema.    Studies Reviewed: SABRA    ECHOCARDIOGRAM COMPLETE 05/16/2023  1. Left ventricular ejection fraction, by estimation, is 60 to 65%. The left ventricle has normal function. The left ventricle has no regional wall motion abnormalities. Left ventricular diastolic parameters were normal. 2. Right ventricular systolic function is normal. The right ventricular size is normal. There is normal pulmonary artery systolic pressure. 3. A small pericardial effusion is present. The pericardial effusion is posterior to the left ventricle.  EKG:         Medications ordered    No orders of the defined types were placed in this encounter.    ASSESSMENT AND PLAN: .      ICD-10-CM   1. Chronic diastolic heart failure (HCC)  P49.67     2. Multifocal atrial tachycardia (HCC)  I47.19     3. Primary hypertension  I10      Assessment & Plan Irregular heart rhythm No atrial fibrillation detected. Irregular heart rhythm is due to extra heartbeats associated with obesity, brief episodes of multifocal atrial tachycardia (MAT) now on diltiazem CD1 20 mg daily.  Treating obesity will help alleviate cardiac symptoms.  Mild heart failure Mild chronic diastolic heart failure symptoms are present, likely exacerbated by obesity. Occasional leg swelling noted, but no fluid accumulation today. Weight management is crucial to reduce symptoms.  Presently on losartan  HCT 50/12.5 mg daily with excellent control of blood pressure and also no further leg edema.  Continue the same.  Obesity Obesity is contributing to extra heartbeats and mild heart failure symptoms. Weight gain is likely due to increased caloric intake from cheese, milk, and juice, and lack of exercise. Caloric restriction and increased physical activity are necessary to manage weight and associated cardiac symptoms. - Implement dietary  restrictions to reduce caloric intake, focusing on reducing cheese, milk, and juice consumption. - Encourage walking within the house to increase physical activity. - Advise family to lock up snacks and control portion sizes, especially for bread and fruits. - Switch to skim milk and eliminate juice from the diet.  Hypertension Hypertension is well controlled with current medication regimen. Blood pressure today was 118/81 mmHg. - Continue current antihypertensive medications: losartan  HCT and diltiazem CD.  Intellectual disabilities Intellectual disabilities present since birth, requiring special considerations for dietary and lifestyle modifications. Family education is essential to ensure adherence to dietary and exercise recommendations. - Educate family on the importance of dietary control and physical activity for managing weight and associated health conditions.  I will see her back on a as needed basis.   Signed,  Gordy Bergamo, MD, [facility] 09/12/2023, 10:51 AM [facility] [address] [place], [place] [address] Phone: [phone]. Fax:  [phone]

## 2023-11-04 ENCOUNTER — Other Ambulatory Visit: Payer: Self-pay | Admitting: Cardiology
